# Patient Record
Sex: Female | Born: 1970 | Race: White | Hispanic: No | Marital: Married | Smoking: Never smoker
Health system: Southern US, Community
[De-identification: ages and names within clinical notes are randomized; demographics above are authoritative.]

## PROBLEM LIST (undated history)

## (undated) DIAGNOSIS — Z1371 Encounter for nonprocreative screening for genetic disease carrier status: Secondary | ICD-10-CM

## (undated) DIAGNOSIS — IMO0002 Reserved for concepts with insufficient information to code with codable children: Secondary | ICD-10-CM

## (undated) DIAGNOSIS — E559 Vitamin D deficiency, unspecified: Secondary | ICD-10-CM

## (undated) DIAGNOSIS — F32A Depression, unspecified: Secondary | ICD-10-CM

## (undated) DIAGNOSIS — F329 Major depressive disorder, single episode, unspecified: Secondary | ICD-10-CM

## (undated) DIAGNOSIS — Z8041 Family history of malignant neoplasm of ovary: Secondary | ICD-10-CM

## (undated) DIAGNOSIS — N200 Calculus of kidney: Secondary | ICD-10-CM

## (undated) DIAGNOSIS — Z87442 Personal history of urinary calculi: Secondary | ICD-10-CM

## (undated) DIAGNOSIS — F419 Anxiety disorder, unspecified: Secondary | ICD-10-CM

## (undated) HISTORY — DX: Calculus of kidney: N20.0

## (undated) HISTORY — PX: LASIK: SHX215

## (undated) HISTORY — PX: TONSILLECTOMY: SUR1361

## (undated) HISTORY — DX: Family history of malignant neoplasm of ovary: Z80.41

## (undated) HISTORY — DX: Depression, unspecified: F32.A

## (undated) HISTORY — DX: Vitamin D deficiency, unspecified: E55.9

## (undated) HISTORY — DX: Reserved for concepts with insufficient information to code with codable children: IMO0002

## (undated) HISTORY — DX: Major depressive disorder, single episode, unspecified: F32.9

## (undated) HISTORY — PX: INTRAUTERINE DEVICE (IUD) INSERTION: SHX5877

## (undated) HISTORY — DX: Encounter for nonprocreative screening for genetic disease carrier status: Z13.71

## (undated) HISTORY — DX: Anxiety disorder, unspecified: F41.9

---

## 1995-05-15 HISTORY — PX: CHOLECYSTECTOMY: SHX55

## 2001-05-14 DIAGNOSIS — IMO0002 Reserved for concepts with insufficient information to code with codable children: Secondary | ICD-10-CM

## 2001-05-14 HISTORY — DX: Reserved for concepts with insufficient information to code with codable children: IMO0002

## 2009-09-12 ENCOUNTER — Ambulatory Visit: Payer: Self-pay | Admitting: Unknown Physician Specialty

## 2014-11-12 DIAGNOSIS — Z1371 Encounter for nonprocreative screening for genetic disease carrier status: Secondary | ICD-10-CM

## 2014-11-12 HISTORY — DX: Encounter for nonprocreative screening for genetic disease carrier status: Z13.71

## 2016-03-19 ENCOUNTER — Other Ambulatory Visit: Payer: Self-pay | Admitting: Obstetrics and Gynecology

## 2016-03-19 DIAGNOSIS — Z1231 Encounter for screening mammogram for malignant neoplasm of breast: Secondary | ICD-10-CM

## 2016-04-17 ENCOUNTER — Ambulatory Visit
Admission: RE | Admit: 2016-04-17 | Discharge: 2016-04-17 | Disposition: A | Discharge status: Home / Self Care | Payer: BLUE CROSS/BLUE SHIELD | Source: Ambulatory Visit | Attending: Obstetrics and Gynecology | Admitting: Obstetrics and Gynecology

## 2016-04-17 DIAGNOSIS — Z1231 Encounter for screening mammogram for malignant neoplasm of breast: Secondary | ICD-10-CM | POA: Diagnosis present

## 2016-04-25 ENCOUNTER — Other Ambulatory Visit: Payer: Self-pay | Admitting: *Deleted

## 2016-04-25 ENCOUNTER — Inpatient Hospital Stay
Admission: RE | Admit: 2016-04-25 | Discharge: 2016-04-25 | Disposition: A | Discharge status: Home / Self Care | Payer: Self-pay | Source: Ambulatory Visit | Attending: *Deleted | Admitting: *Deleted

## 2016-04-25 DIAGNOSIS — Z9289 Personal history of other medical treatment: Secondary | ICD-10-CM

## 2016-04-30 ENCOUNTER — Other Ambulatory Visit: Payer: Self-pay | Admitting: Obstetrics and Gynecology

## 2016-04-30 DIAGNOSIS — N6489 Other specified disorders of breast: Secondary | ICD-10-CM

## 2016-04-30 DIAGNOSIS — R928 Other abnormal and inconclusive findings on diagnostic imaging of breast: Secondary | ICD-10-CM

## 2016-05-17 ENCOUNTER — Ambulatory Visit: Payer: BLUE CROSS/BLUE SHIELD

## 2016-05-17 ENCOUNTER — Ambulatory Visit
Admission: RE | Admit: 2016-05-17 | Discharge: 2016-05-17 | Disposition: A | Discharge status: Home / Self Care | Payer: PRIVATE HEALTH INSURANCE | Source: Ambulatory Visit | Attending: Obstetrics and Gynecology | Admitting: Obstetrics and Gynecology

## 2016-05-17 ENCOUNTER — Other Ambulatory Visit: Payer: BLUE CROSS/BLUE SHIELD

## 2016-05-17 DIAGNOSIS — N6489 Other specified disorders of breast: Secondary | ICD-10-CM

## 2016-05-17 DIAGNOSIS — R928 Other abnormal and inconclusive findings on diagnostic imaging of breast: Secondary | ICD-10-CM

## 2017-01-28 ENCOUNTER — Ambulatory Visit: Payer: Self-pay | Admitting: Obstetrics and Gynecology

## 2017-02-05 ENCOUNTER — Ambulatory Visit (INDEPENDENT_AMBULATORY_CARE_PROVIDER_SITE_OTHER): Payer: PRIVATE HEALTH INSURANCE | Admitting: Obstetrics and Gynecology

## 2017-02-05 ENCOUNTER — Encounter: Payer: Self-pay | Admitting: Obstetrics and Gynecology

## 2017-02-05 VITALS — BP 100/60 | HR 74 | Ht 63.0 in | Wt 155.0 lb

## 2017-02-05 DIAGNOSIS — Z01419 Encounter for gynecological examination (general) (routine) without abnormal findings: Secondary | ICD-10-CM

## 2017-02-05 DIAGNOSIS — Z0000 Encounter for general adult medical examination without abnormal findings: Secondary | ICD-10-CM | POA: Diagnosis not present

## 2017-02-05 DIAGNOSIS — Z8041 Family history of malignant neoplasm of ovary: Secondary | ICD-10-CM | POA: Insufficient documentation

## 2017-02-05 DIAGNOSIS — Z13220 Encounter for screening for lipoid disorders: Secondary | ICD-10-CM | POA: Diagnosis not present

## 2017-02-05 DIAGNOSIS — Z1151 Encounter for screening for human papillomavirus (HPV): Secondary | ICD-10-CM

## 2017-02-05 DIAGNOSIS — Z30431 Encounter for routine checking of intrauterine contraceptive device: Secondary | ICD-10-CM | POA: Diagnosis not present

## 2017-02-05 DIAGNOSIS — Z1231 Encounter for screening mammogram for malignant neoplasm of breast: Secondary | ICD-10-CM | POA: Diagnosis not present

## 2017-02-05 DIAGNOSIS — Z124 Encounter for screening for malignant neoplasm of cervix: Secondary | ICD-10-CM

## 2017-02-05 DIAGNOSIS — E559 Vitamin D deficiency, unspecified: Secondary | ICD-10-CM | POA: Diagnosis not present

## 2017-02-05 DIAGNOSIS — Z1239 Encounter for other screening for malignant neoplasm of breast: Secondary | ICD-10-CM

## 2017-02-05 NOTE — Progress Notes (Signed)
PCP:  Patient, No Pcp Per   Chief Complaint  Patient presents with  . Gynecologic Exam     HPI:      [name] is a 46 y.o. Patricia Randolph who LMP was No LMP recorded. Patient is not currently having periods (Reason: IUD)., presents today for her annual examination.  Her menses are absent with IUD.  Dysmenorrhea none. She had 3 weeks of light spotting a few wks ago. Unusual for pt but sx resolved.   Sex activity: single partner, contraception - IUD. Mirena placed 09/28/15 Last Pap: November 09, 2013  Results were: no abnormalities /neg HPV DNA  Hx of STDs: none  Last mammogram: May 17, 2016  Results were: normal--routine follow-up in 12 months There is no FH of breast cancer. There is a FH of ovarian cancer in her PGM and pat grt aunt. Pt is MyRisk neg 2016. The patient does not do self-breast exams.  Tobacco use: The patient denies current or previous tobacco use. Alcohol use: social drinker No drug use.  Exercise: moderately active  She does not get adequate calcium and Vitamin D in her diet.  She has not had recent fasting/screening labs. She has a hx of vit D deficiency but isn't taking her Vit D supp.   Past Medical History:  Diagnosis Date  . Anxiety   . BRCA negative 11/2014   MyRisk negative  . Depression   . Family history of ovarian cancer   . History of prescription drug abuse ([place]) 2003  . Vitamin D deficiency     Past Surgical History:  Procedure Laterality Date  . CESAREAN SECTION    . CHOLECYSTECTOMY  1997  . INTRAUTERINE DEVICE (IUD) INSERTION      Family History  Problem Relation Age of Onset  . Hypertension Mother   . Colon cancer Maternal Grandfather 63  . Ovarian cancer Paternal Grandmother 60  . Ovarian cancer Other 48  . Breast cancer Neg Hx     Social History   Social History  . Marital status: Married    Spouse name: N/A  . Number of children: N/A  . Years of education: N/A   Occupational History  . Not on file.   Social  History Main Topics  . Smoking status: Never Smoker  . Smokeless tobacco: Never Used  . Alcohol use Yes     Comment: socially  . Drug use: No  . Sexual activity: Yes    Birth control/ protection: IUD   Other Topics Concern  . Not on file   Social History Narrative  . No narrative on file    Current Meds  Medication Sig  . levonorgestrel (MIRENA) 20 MCG/24HR IUD 1 each by Intrauterine route once.     ROS:  Review of Systems  Constitutional: Negative for fatigue, fever and unexpected weight change.  Respiratory: Negative for cough, shortness of breath and wheezing.   Cardiovascular: Negative for chest pain, palpitations and leg swelling.  Gastrointestinal: Negative for blood in stool, constipation, diarrhea, nausea and vomiting.  Endocrine: Negative for cold intolerance, heat intolerance and polyuria.  Genitourinary: Negative for dyspareunia, dysuria, flank pain, frequency, genital sores, hematuria, menstrual problem, pelvic pain, urgency, vaginal bleeding, vaginal discharge and vaginal pain.  Musculoskeletal: Negative for back pain, joint swelling and myalgias.  Skin: Negative for rash.  Neurological: Negative for dizziness, syncope, light-headedness, numbness and headaches.  Hematological: Negative for adenopathy.  Psychiatric/Behavioral: Negative for agitation, confusion, sleep disturbance and suicidal ideas. The patient is not  nervous/anxious.      Objective: BP 100/60   Pulse 74   Ht 5' 3"  (1.6 m)   Wt 155 lb (70.3 kg)   BMI 27.46 kg/m    Physical Exam  Constitutional: She is oriented to person, place, and time. She appears well-developed and well-nourished.  Genitourinary: Vagina normal and uterus normal. There is no rash or tenderness on the right labia. There is no rash or tenderness on the left labia. No erythema or tenderness in the vagina. No vaginal discharge found. Right adnexum does not display mass and does not display tenderness. Left adnexum does not  display mass and does not display tenderness.  Cervix exhibits visible IUD strings. Cervix does not exhibit motion tenderness or polyp. Uterus is not enlarged or tender.  Neck: Normal range of motion. No thyromegaly present.  Cardiovascular: Normal rate, regular rhythm and normal heart sounds.   No murmur heard. Pulmonary/Chest: Effort normal and breath sounds normal. Right breast exhibits no mass, no nipple discharge, no skin change and no tenderness. Left breast exhibits no mass, no nipple discharge, no skin change and no tenderness.  Abdominal: Soft. There is no tenderness. There is no guarding.  Musculoskeletal: Normal range of motion.  Neurological: She is alert and oriented to person, place, and time. No cranial nerve deficit.  Psychiatric: She has a normal mood and affect. Her behavior is normal.  Vitals reviewed.   Assessment/Plan: Encounter for annual routine gynecological examination  Cervical cancer screening - Plan: IGP, Aptima HPV  Screening for HPV (human papillomavirus) - Plan: IGP, Aptima HPV  Screening for breast cancer - Pt to sched mammo. - Plan: MM DIGITAL SCREENING BILATERAL  Encounter for routine checking of intrauterine contraceptive device (IUD) - IUD in place. Due for rem 09/2020  Blood tests for routine general physical examination - Plan: Comprehensive metabolic panel, Lipid panel  Screening cholesterol level - Plan: Lipid panel  Vitamin D deficiency - Add Vit D3 2000 IU daily.   Family history of ovarian cancer - Pt is MyRisk neg. No further screening needed.   Meds ordered this encounter  Medications  . levonorgestrel (MIRENA) 20 MCG/24HR IUD    Sig: 1 each by Intrauterine route once.             GYN counsel mammography screening, adequate intake of calcium and vitamin D, diet and exercise     F/U  Return in about 1 year (around 02/05/2018).  Bertha Lokken B. Jerson Furukawa, PA-C 02/05/2017 9:14 AM

## 2017-02-05 NOTE — Addendum Note (Signed)
Addended by: Althea Grimmer B on: 02/05/2017 09:15 AM   Modules accepted: Orders

## 2017-02-05 NOTE — Patient Instructions (Signed)
[facility]: [phone]    

## 2017-02-07 LAB — IGP, APTIMA HPV
HPV Aptima: NEGATIVE
PAP Smear Comment: 0

## 2018-03-24 ENCOUNTER — Telehealth: Payer: Self-pay | Admitting: Obstetrics and Gynecology

## 2018-03-24 ENCOUNTER — Other Ambulatory Visit: Payer: Self-pay | Admitting: Obstetrics and Gynecology

## 2018-03-24 DIAGNOSIS — Z0000 Encounter for general adult medical examination without abnormal findings: Secondary | ICD-10-CM

## 2018-03-24 DIAGNOSIS — Z1231 Encounter for screening mammogram for malignant neoplasm of breast: Secondary | ICD-10-CM

## 2018-03-24 DIAGNOSIS — Z13220 Encounter for screening for lipoid disorders: Secondary | ICD-10-CM

## 2018-03-24 NOTE — Telephone Encounter (Signed)
New orders placed

## 2018-03-24 NOTE — Addendum Note (Signed)
Addended by: Althea Grimmer B on: 03/24/2018 01:28 PM   Modules accepted: Orders

## 2018-03-24 NOTE — Telephone Encounter (Signed)
Patient is schedule for patient labs Tomorrow But the order for those are no long valid. Would you please reorder Comprehensive Metabolic lab and Lipid panel? Thank you

## 2018-03-25 ENCOUNTER — Other Ambulatory Visit: Payer: PRIVATE HEALTH INSURANCE

## 2018-03-25 ENCOUNTER — Ambulatory Visit
Admission: RE | Admit: 2018-03-25 | Discharge: 2018-03-25 | Disposition: A | Discharge status: Home / Self Care | Payer: PRIVATE HEALTH INSURANCE | Source: Ambulatory Visit | Attending: Obstetrics and Gynecology | Admitting: Obstetrics and Gynecology

## 2018-03-25 DIAGNOSIS — Z1231 Encounter for screening mammogram for malignant neoplasm of breast: Secondary | ICD-10-CM | POA: Insufficient documentation

## 2018-03-25 DIAGNOSIS — Z0000 Encounter for general adult medical examination without abnormal findings: Secondary | ICD-10-CM

## 2018-03-25 DIAGNOSIS — Z13220 Encounter for screening for lipoid disorders: Secondary | ICD-10-CM

## 2018-03-26 ENCOUNTER — Telehealth: Payer: Self-pay | Admitting: Obstetrics and Gynecology

## 2018-03-26 LAB — COMPREHENSIVE METABOLIC PANEL
ALK PHOS: 57 IU/L (ref 39–117)
ALT: 154 IU/L — ABNORMAL HIGH (ref 0–32)
AST: 153 IU/L — AB (ref 0–40)
Albumin/Globulin Ratio: 1.7 (ref 1.2–2.2)
Albumin: 5.5 g/dL (ref 3.5–5.5)
BUN/Creatinine Ratio: 19 (ref 9–23)
BUN: 15 mg/dL (ref 6–24)
Bilirubin Total: 0.9 mg/dL (ref 0.0–1.2)
CO2: 22 mmol/L (ref 20–29)
CREATININE: 0.79 mg/dL (ref 0.57–1.00)
Calcium: 10.2 mg/dL (ref 8.7–10.2)
Chloride: 94 mmol/L — ABNORMAL LOW (ref 96–106)
GFR calc Af Amer: 105 mL/min/{1.73_m2} (ref >59)
GFR calc non Af Amer: 91 mL/min/{1.73_m2} (ref >59)
GLOBULIN, TOTAL: 3.3 g/dL (ref 1.5–4.5)
GLUCOSE: 68 mg/dL (ref 65–99)
Potassium: 4.9 mmol/L (ref 3.5–5.2)
SODIUM: 137 mmol/L (ref 134–144)
TOTAL PROTEIN: 8.8 g/dL — AB (ref 6.0–8.5)

## 2018-03-26 LAB — LIPID PANEL
Chol/HDL Ratio: 1.9 ratio (ref 0.0–4.4)
Cholesterol, Total: 258 mg/dL — ABNORMAL HIGH (ref 100–199)
HDL: 136 mg/dL (ref >39)
LDL Calculated: 107 mg/dL — ABNORMAL HIGH (ref 0–99)
TRIGLYCERIDES: 76 mg/dL (ref 0–149)
VLDL CHOLESTEROL CAL: 15 mg/dL (ref 5–40)

## 2018-03-26 NOTE — Telephone Encounter (Signed)
Pt aware of lipids and CMP. Has elevated LFTs. Pt taking about 1000 mg daily of tylenol for aches/pains and drinks alcohol daily. Recommended changing to NSAIDs and stopping alcohol. Pt thinks she can do that. Will rechk LFTs at 04/15/18 annual.

## 2018-04-15 ENCOUNTER — Ambulatory Visit (INDEPENDENT_AMBULATORY_CARE_PROVIDER_SITE_OTHER): Payer: PRIVATE HEALTH INSURANCE | Admitting: Obstetrics and Gynecology

## 2018-04-15 ENCOUNTER — Encounter: Payer: Self-pay | Admitting: Obstetrics and Gynecology

## 2018-04-15 VITALS — BP 118/70 | HR 82 | Ht 63.0 in | Wt 157.0 lb

## 2018-04-15 DIAGNOSIS — R7989 Other specified abnormal findings of blood chemistry: Secondary | ICD-10-CM

## 2018-04-15 DIAGNOSIS — Z01419 Encounter for gynecological examination (general) (routine) without abnormal findings: Secondary | ICD-10-CM | POA: Diagnosis not present

## 2018-04-15 DIAGNOSIS — Z1239 Encounter for other screening for malignant neoplasm of breast: Secondary | ICD-10-CM

## 2018-04-15 DIAGNOSIS — Z30431 Encounter for routine checking of intrauterine contraceptive device: Secondary | ICD-10-CM

## 2018-04-15 DIAGNOSIS — R945 Abnormal results of liver function studies: Secondary | ICD-10-CM

## 2018-04-15 NOTE — Progress Notes (Signed)
 PCP:  Copland, Alicia B, PA-C   Chief Complaint  Patient presents with  . Gynecologic Exam     HPI:      [name] is a 47 y.o. G2P2002 who LMP was No LMP recorded. (Menstrual status: IUD)., presents today for her annual examination.  Her menses are absent with IUD.  Dysmenorrhea none. No BTB.  Sex activity: single partner, contraception - IUD. Mirena placed 09/28/15 Last Pap: 02/05/17  Results were: no abnormalities /neg HPV DNA  Hx of STDs: none  Last mammogram: 03/25/18  Results were: normal--routine follow-up in 12 months There is no FH of breast cancer. There is a FH of ovarian cancer in her PGM and pat grt aunt and colon cancer in her MGF. Pt is MyRisk neg 2016. The patient does not do self-breast exams.  Tobacco use: The patient denies current or previous tobacco use. Alcohol use: social drinker--has cut back No drug use.  Exercise: moderately active  She does get adequate calcium but not Vitamin D in her diet. She has a hx of vit D deficiency but isn't taking supp like she should.   She had normal fasting labs 11/19 except for significantly elevated LFTs. Pt was taking tylenol regularly and drinking alcohol daily. Pt has stopped this and is due for lab recheck today.  Past Medical History:  Diagnosis Date  . Anxiety   . BRCA negative 11/2014   MyRisk negative  . Depression   . Family history of ovarian cancer   . History of prescription drug abuse 2003  . Vitamin D deficiency     Past Surgical History:  Procedure Laterality Date  . CESAREAN SECTION    . CHOLECYSTECTOMY  1997  . INTRAUTERINE DEVICE (IUD) INSERTION      Family History  Problem Relation Age of Onset  . Hypertension Mother   . Colon cancer Maternal Grandfather 50  . Ovarian cancer Paternal Grandmother 70  . Ovarian cancer Other 70  . Breast cancer Neg Hx     Social History   Socioeconomic History  . Marital status: Married    Spouse name: Not on file  . Number of children:  Not on file  . Years of education: Not on file  . Highest education level: Not on file  Occupational History  . Not on file  Social Needs  . Financial resource strain: Not on file  . Food insecurity:    Worry: Not on file    Inability: Not on file  . Transportation needs:    Medical: Not on file    Non-medical: Not on file  Tobacco Use  . Smoking status: Never Smoker  . Smokeless tobacco: Never Used  Substance and Sexual Activity  . Alcohol use: Yes    Comment: socially  . Drug use: No  . Sexual activity: Yes    Birth control/protection: IUD    Comment: Mirena  Lifestyle  . Physical activity:    Days per week: Not on file    Minutes per session: Not on file  . Stress: Not on file  Relationships  . Social connections:    Talks on phone: Not on file    Gets together: Not on file    Attends religious service: Not on file    Active member of club or organization: Not on file    Attends meetings of clubs or organizations: Not on file    Relationship status: Not on file  . Intimate partner violence:      Fear of current or ex partner: Not on file    Emotionally abused: Not on file    Physically abused: Not on file    Forced sexual activity: Not on file  Other Topics Concern  . Not on file  Social History Narrative  . Not on file    Current Meds  Medication Sig  . levonorgestrel (MIRENA) 20 MCG/24HR IUD 1 each by Intrauterine route once.     ROS:  Review of Systems  Constitutional: Negative for fatigue, fever and unexpected weight change.  Respiratory: Negative for cough, shortness of breath and wheezing.   Cardiovascular: Negative for chest pain, palpitations and leg swelling.  Gastrointestinal: Negative for blood in stool, constipation, diarrhea, nausea and vomiting.  Endocrine: Negative for cold intolerance, heat intolerance and polyuria.  Genitourinary: Negative for dyspareunia, dysuria, flank pain, frequency, genital sores, hematuria, menstrual problem, pelvic  pain, urgency, vaginal bleeding, vaginal discharge and vaginal pain.  Musculoskeletal: Negative for back pain, joint swelling and myalgias.  Skin: Negative for rash.  Neurological: Negative for dizziness, syncope, light-headedness, numbness and headaches.  Hematological: Negative for adenopathy.  Psychiatric/Behavioral: Negative for agitation, confusion, sleep disturbance and suicidal ideas. The patient is not nervous/anxious.      Objective: BP 118/70   Pulse 82   Ht 5' 3" (1.6 m)   Wt 157 lb (71.2 kg)   BMI 27.81 kg/m    Physical Exam  Constitutional: She is oriented to person, place, and time. She appears well-developed and well-nourished.  Genitourinary: Vagina normal and uterus normal. There is no rash or tenderness on the right labia. There is no rash or tenderness on the left labia. No erythema or tenderness in the vagina. No vaginal discharge found. Right adnexum does not display mass and does not display tenderness. Left adnexum does not display mass and does not display tenderness.  Cervix exhibits visible IUD strings. Cervix does not exhibit motion tenderness or polyp. Uterus is not enlarged or tender.  Neck: Normal range of motion. No thyromegaly present.  Cardiovascular: Normal rate, regular rhythm and normal heart sounds.  No murmur heard. Pulmonary/Chest: Effort normal and breath sounds normal. Right breast exhibits no mass, no nipple discharge, no skin change and no tenderness. Left breast exhibits no mass, no nipple discharge, no skin change and no tenderness.  Abdominal: Soft. There is no tenderness. There is no guarding.  Musculoskeletal: Normal range of motion.  Neurological: She is alert and oriented to person, place, and time. No cranial nerve deficit.  Psychiatric: She has a normal mood and affect. Her behavior is normal.  Vitals reviewed.   Assessment/Plan: Encounter for annual routine gynecological examination  Encounter for routine checking of  intrauterine contraceptive device (IUD) - IUD in place  Screening for breast cancer - Pt current on mammo  Abnormal LFTs - REchk labs today. Will call with results.  - Plan: Comprehensive metabolic panel        GYN counsel mammography screening, adequate intake of calcium and vitamin D, diet and exercise     F/U  Return in about 1 year (around 04/16/2019).  Alicia B. Copland, PA-C 04/15/2018 8:48 AM 

## 2018-04-15 NOTE — Patient Instructions (Signed)
I value your feedback and entrusting us with your care. If you get a [facility] patient survey, I would appreciate you taking the time to let us know about your experience today. Thank you! 

## 2018-04-16 LAB — COMPREHENSIVE METABOLIC PANEL
A/G RATIO: 1.9 (ref 1.2–2.2)
ALBUMIN: 4.9 g/dL (ref 3.5–5.5)
ALT: 19 IU/L (ref 0–32)
AST: 19 IU/L (ref 0–40)
Alkaline Phosphatase: 42 IU/L (ref 39–117)
BILIRUBIN TOTAL: 0.9 mg/dL (ref 0.0–1.2)
BUN / CREAT RATIO: 14 (ref 9–23)
BUN: 10 mg/dL (ref 6–24)
CHLORIDE: 101 mmol/L (ref 96–106)
CO2: 22 mmol/L (ref 20–29)
Calcium: 9.2 mg/dL (ref 8.7–10.2)
Creatinine, Ser: 0.74 mg/dL (ref 0.57–1.00)
GFR calc non Af Amer: 98 mL/min/{1.73_m2} (ref >59)
GFR, EST AFRICAN AMERICAN: 113 mL/min/{1.73_m2} (ref >59)
Globulin, Total: 2.6 g/dL (ref 1.5–4.5)
Glucose: 80 mg/dL (ref 65–99)
POTASSIUM: 4.2 mmol/L (ref 3.5–5.2)
SODIUM: 140 mmol/L (ref 134–144)
TOTAL PROTEIN: 7.5 g/dL (ref 6.0–8.5)

## 2018-04-16 NOTE — Progress Notes (Signed)
Pls let pt know labs WNL. Liver values normal. F/u prn.

## 2018-04-16 NOTE — Progress Notes (Signed)
Pt aware.

## 2018-12-20 ENCOUNTER — Telehealth: Payer: PRIVATE HEALTH INSURANCE | Admitting: Nurse Practitioner

## 2018-12-20 DIAGNOSIS — N3000 Acute cystitis without hematuria: Secondary | ICD-10-CM | POA: Diagnosis not present

## 2018-12-20 MED ORDER — CEPHALEXIN 500 MG PO CAPS: 500.0000 mg | ORAL_CAPSULE | Freq: Two times a day (BID) | ORAL | 0 refills | Status: DC

## 2018-12-20 NOTE — Progress Notes (Signed)

## 2019-01-13 ENCOUNTER — Other Ambulatory Visit: Payer: Self-pay | Admitting: Emergency Medicine

## 2019-01-13 DIAGNOSIS — R6889 Other general symptoms and signs: Secondary | ICD-10-CM | POA: Diagnosis not present

## 2019-01-13 DIAGNOSIS — Z20822 Contact with and (suspected) exposure to covid-19: Secondary | ICD-10-CM

## 2019-01-14 LAB — NOVEL CORONAVIRUS, NAA: SARS-CoV-2, NAA: NOT DETECTED

## 2019-02-03 ENCOUNTER — Other Ambulatory Visit: Payer: Self-pay

## 2019-02-03 DIAGNOSIS — Z20822 Contact with and (suspected) exposure to covid-19: Secondary | ICD-10-CM

## 2019-02-03 DIAGNOSIS — R6889 Other general symptoms and signs: Secondary | ICD-10-CM | POA: Diagnosis not present

## 2019-02-04 LAB — NOVEL CORONAVIRUS, NAA: SARS-CoV-2, NAA: NOT DETECTED

## 2019-02-09 ENCOUNTER — Other Ambulatory Visit: Payer: Self-pay

## 2019-02-09 DIAGNOSIS — Z20822 Contact with and (suspected) exposure to covid-19: Secondary | ICD-10-CM

## 2019-02-10 LAB — NOVEL CORONAVIRUS, NAA: SARS-CoV-2, NAA: NOT DETECTED

## 2019-02-16 ENCOUNTER — Other Ambulatory Visit: Payer: Self-pay

## 2019-02-16 ENCOUNTER — Ambulatory Visit (INDEPENDENT_AMBULATORY_CARE_PROVIDER_SITE_OTHER): Payer: BC Managed Care – PPO | Admitting: Obstetrics and Gynecology

## 2019-02-16 ENCOUNTER — Encounter: Payer: Self-pay | Admitting: Obstetrics and Gynecology

## 2019-02-16 VITALS — BP 104/80 | Ht 63.0 in | Wt 153.0 lb

## 2019-02-16 DIAGNOSIS — Z23 Encounter for immunization: Secondary | ICD-10-CM

## 2019-02-16 DIAGNOSIS — N3001 Acute cystitis with hematuria: Secondary | ICD-10-CM | POA: Diagnosis not present

## 2019-02-16 LAB — POCT URINALYSIS DIPSTICK
Bilirubin, UA: NEGATIVE
Glucose, UA: NEGATIVE
Ketones, UA: NEGATIVE
Nitrite, UA: NEGATIVE
Protein, UA: NEGATIVE
Spec Grav, UA: 1.01 (ref 1.010–1.025)
pH, UA: 7 (ref 5.0–8.0)

## 2019-02-16 MED ORDER — NITROFURANTOIN MONOHYD MACRO 100 MG PO CAPS: 100.0000 mg | ORAL_CAPSULE | Freq: Two times a day (BID) | ORAL | 0 refills | Status: AC

## 2019-02-16 NOTE — Patient Instructions (Signed)
I value your feedback and entrusting us with your care. If you get a [facility] patient survey, I would appreciate you taking the time to let us know about your experience today. Thank you! 

## 2019-02-16 NOTE — Progress Notes (Signed)
Majour Frei, Deirdre Evener, PA-C   Chief Complaint  Patient presents with  . Urinary Tract Infection    frequent urination, back pain, no blood in urine or burning urinating since last night, took AZO around 2 am, pt has requested flu shot    HPI:      [name] is a 48 y.o. [id number] who LMP was No LMP recorded. (Menstrual status: IUD)., presents today for UTI sx of urinary frequency, urgency, LBP since last night. No hematuria/dysuria, pelvic pain, fevers. No vag sx, no vag bleeding. Taking AZO. Had similar sx 8/20 and did evisit, given keflex BID for 5 days. Sx improved but have recurred. Last UTI about 19 yrs ago prior to this.  Past Medical History:  Diagnosis Date  . Anxiety   . BRCA negative 11/2014   MyRisk negative  . Depression   . Family history of ovarian cancer   . History of prescription drug abuse 2003  . Vitamin D deficiency     Past Surgical History:  Procedure Laterality Date  . CESAREAN SECTION    . CHOLECYSTECTOMY  1997  . INTRAUTERINE DEVICE (IUD) INSERTION      Family History  Problem Relation Age of Onset  . Hypertension Mother   . Colon cancer Maternal Grandfather 34  . Ovarian cancer Paternal Grandmother 73  . Ovarian cancer Other 54  . Breast cancer Neg Hx     Social History   Socioeconomic History  . Marital status: Married    Spouse name: Not on file  . Number of children: Not on file  . Years of education: Not on file  . Highest education level: Not on file  Occupational History  . Not on file  Social Needs  . Financial resource strain: Not on file  . Food insecurity    Worry: Not on file    Inability: Not on file  . Transportation needs    Medical: Not on file    Non-medical: Not on file  Tobacco Use  . Smoking status: Never Smoker  . Smokeless tobacco: Never Used  Substance and Sexual Activity  . Alcohol use: Yes    Comment: socially  . Drug use: No  . Sexual activity: Yes    Birth control/protection: I.U.D.   Comment: Mirena  Lifestyle  . Physical activity    Days per week: Not on file    Minutes per session: Not on file  . Stress: Not on file  Relationships  . Social Herbalist on phone: Not on file    Gets together: Not on file    Attends religious service: Not on file    Active member of club or organization: Not on file    Attends meetings of clubs or organizations: Not on file    Relationship status: Not on file  . Intimate partner violence    Fear of current or ex partner: Not on file    Emotionally abused: Not on file    Physically abused: Not on file    Forced sexual activity: Not on file  Other Topics Concern  . Not on file  Social History Narrative  . Not on file    Outpatient Medications Prior to Visit  Medication Sig Dispense Refill  . levonorgestrel (MIRENA) 20 MCG/24HR IUD 1 each by Intrauterine route once.    . cephALEXin (KEFLEX) 500 MG capsule Take 1 capsule (500 mg total) by mouth 2 (two) times daily. 14 capsule 0  No facility-administered medications prior to visit.       ROS:  Review of Systems  Constitutional: Negative for fever.  Gastrointestinal: Negative for blood in stool, constipation, diarrhea, nausea and vomiting.  Genitourinary: Positive for frequency and urgency. Negative for dyspareunia, dysuria, flank pain, hematuria, vaginal bleeding, vaginal discharge and vaginal pain.  Musculoskeletal: Positive for back pain.  Skin: Negative for rash.   OBJECTIVE:   Vitals:  BP 104/80   Ht 5' 3"  (1.6 m)   Wt 153 lb (69.4 kg)   BMI 27.10 kg/m   Physical Exam Vitals signs reviewed.  Constitutional:      Appearance: She is well-developed. She is not ill-appearing or toxic-appearing.  Neck:     Musculoskeletal: Normal range of motion.  Pulmonary:     Effort: Pulmonary effort is normal.  Abdominal:     Tenderness: There is no right CVA tenderness or left CVA tenderness.  Musculoskeletal: Normal range of motion.  Neurological:      General: No focal deficit present.     Mental Status: She is alert and oriented to person, place, and time.     Cranial Nerves: No cranial nerve deficit.  Psychiatric:        Behavior: Behavior normal.        Thought Content: Thought content normal.        Judgment: Judgment normal.     Results: Results for orders placed or performed in visit on 02/16/19 (from the past 24 hour(s))  POCT Urinalysis Dipstick     Status: Abnormal   Collection Time: 02/16/19 12:03 PM  Result Value Ref Range   Color, UA orange    Clarity, UA clear    Glucose, UA Negative Negative   Bilirubin, UA neg    Ketones, UA neg    Spec Grav, UA 1.010 1.010 - 1.025   Blood, UA mod    pH, UA 7.0 5.0 - 8.0   Protein, UA Negative Negative   Urobilinogen, UA     Nitrite, UA neg    Leukocytes, UA Small (1+) (A) Negative   Appearance     Odor       Assessment/Plan: Acute cystitis with hematuria - Plan: POCT Urinalysis Dipstick, nitrofurantoin, macrocrystal-monohydrate, (MACROBID) 100 MG capsule, Urine Culture; Pos sx/UA. Rx macrobid. Check C&S. Will f/u with results.   Needs flu shot - Plan: Flu Vaccine QUAD 36+ mos IM (Fluarix, Quad PF)    Meds ordered this encounter  Medications  . nitrofurantoin, macrocrystal-monohydrate, (MACROBID) 100 MG capsule    Sig: Take 1 capsule (100 mg total) by mouth 2 (two) times daily for 7 days.    Dispense:  14 capsule    Refill:  0    Order Specific Question:   Supervising Provider    Answer:   Gae Dry [[id number]]      Return if symptoms worsen or fail to improve.  Patricia Kalama B. Obelia Bonello, PA-C 02/16/2019 1:36 PM

## 2019-02-18 ENCOUNTER — Telehealth: Payer: Self-pay | Admitting: Obstetrics and Gynecology

## 2019-02-18 LAB — URINE CULTURE

## 2019-02-18 NOTE — Telephone Encounter (Signed)
LM for pt with neg C&S. Given hematuria, needs to RTO in 1 wk for UA with nurse.

## 2019-02-23 ENCOUNTER — Other Ambulatory Visit: Payer: Self-pay

## 2019-02-23 DIAGNOSIS — Z20828 Contact with and (suspected) exposure to other viral communicable diseases: Secondary | ICD-10-CM | POA: Diagnosis not present

## 2019-02-23 DIAGNOSIS — Z20822 Contact with and (suspected) exposure to covid-19: Secondary | ICD-10-CM

## 2019-02-24 ENCOUNTER — Other Ambulatory Visit: Payer: Self-pay

## 2019-02-24 ENCOUNTER — Ambulatory Visit (INDEPENDENT_AMBULATORY_CARE_PROVIDER_SITE_OTHER): Payer: BC Managed Care – PPO

## 2019-02-24 DIAGNOSIS — N390 Urinary tract infection, site not specified: Secondary | ICD-10-CM

## 2019-02-24 DIAGNOSIS — R319 Hematuria, unspecified: Secondary | ICD-10-CM | POA: Diagnosis not present

## 2019-02-24 LAB — NOVEL CORONAVIRUS, NAA: SARS-CoV-2, NAA: NOT DETECTED

## 2019-02-24 LAB — POCT URINALYSIS DIPSTICK
Glucose, UA: NEGATIVE
Leukocytes, UA: NEGATIVE
Nitrite, UA: NEGATIVE
Protein, UA: POSITIVE — AB
Spec Grav, UA: 1.01 (ref 1.010–1.025)
Urobilinogen, UA: NEGATIVE E.U./dL — AB
pH, UA: 7 (ref 5.0–8.0)

## 2019-02-24 NOTE — Progress Notes (Signed)
Pls let pt know urine is normal, no blood in urine. F/u prn.

## 2019-02-24 NOTE — Progress Notes (Signed)
Pt came in office to drop off urine sample per ABC.

## 2019-02-24 NOTE — Progress Notes (Signed)
Pt aware.

## 2019-03-11 DIAGNOSIS — Z20828 Contact with and (suspected) exposure to other viral communicable diseases: Secondary | ICD-10-CM | POA: Diagnosis not present

## 2019-03-13 ENCOUNTER — Other Ambulatory Visit: Payer: Self-pay

## 2019-03-13 DIAGNOSIS — Z20822 Contact with and (suspected) exposure to covid-19: Secondary | ICD-10-CM

## 2019-03-14 LAB — NOVEL CORONAVIRUS, NAA: SARS-CoV-2, NAA: NOT DETECTED

## 2019-03-18 NOTE — Patient Instructions (Signed)
I value your feedback and entrusting us with your care. If you get a [facility] patient survey, I would appreciate you taking the time to let us know about your experience today. Thank you! 

## 2019-03-18 NOTE — Progress Notes (Signed)
[place], Deirdre Evener, PA-C   Chief Complaint  Patient presents with  . Urinary Tract Infection    urinary frequency, bladder pain, back pain, no blood in urine or burning sensation when urinating x 1 month    HPI:      [name] is a 48 y.o. [id number] who LMP was No LMP recorded. (Menstrual status: IUD)., presents today for urinary frequency/urgency/pelvic pain and RT mid back pain for the past 1 1/2 wks. No dysuria, hematuria. Treating with ibup/bath for pelvic pain and back pain. Trying to cut back on caffeine, drinks very little water due to urgency/frequency. Had UTI sx (without pelvic pain) 02/16/19 with hematuria on UA and neg C&S, treated with macrobid without sx relief. Repeat UA 1 wk later was neg. Had similar sx 8/20 and did evisit, given keflex BID for 5 days. In hindsight, pt thinks she may have had a kidney stone 8/20 due to LT flank pain.  Last UTI about 19 yrs ago prior to this. No vag or GI sx.   Pt has IUD, is amenorrheic. No irreg bleeding. She is sex active, no new partners, no dyspareunia.   Last annual 12/19.  Patient Active Problem List   Diagnosis Date Noted  . Vitamin D deficiency 02/05/2017  . Family history of ovarian cancer 02/05/2017    Past Surgical History:  Procedure Laterality Date  . CESAREAN SECTION    . CHOLECYSTECTOMY  1997  . INTRAUTERINE DEVICE (IUD) INSERTION      Family History  Problem Relation Age of Onset  . Hypertension Mother   . Colon cancer Maternal Grandfather 42  . Ovarian cancer Paternal Grandmother 76  . Ovarian cancer Other 96  . Breast cancer Neg Hx     Social History   Socioeconomic History  . Marital status: Married    Spouse name: Not on file  . Number of children: Not on file  . Years of education: Not on file  . Highest education level: Not on file  Occupational History  . Not on file  Social Needs  . Financial resource strain: Not on file  . Food insecurity    Worry: Not on file    Inability: Not  on file  . Transportation needs    Medical: Not on file    Non-medical: Not on file  Tobacco Use  . Smoking status: Never Smoker  . Smokeless tobacco: Never Used  Substance and Sexual Activity  . Alcohol use: Yes    Comment: socially  . Drug use: No  . Sexual activity: Yes    Birth control/protection: I.U.D.    Comment: Mirena  Lifestyle  . Physical activity    Days per week: Not on file    Minutes per session: Not on file  . Stress: Not on file  Relationships  . Social Herbalist on phone: Not on file    Gets together: Not on file    Attends religious service: Not on file    Active member of club or organization: Not on file    Attends meetings of clubs or organizations: Not on file    Relationship status: Not on file  . Intimate partner violence    Fear of current or ex partner: Not on file    Emotionally abused: Not on file    Physically abused: Not on file    Forced sexual activity: Not on file  Other Topics Concern  . Not on file  Social  History Narrative  . Not on file    Outpatient Medications Prior to Visit  Medication Sig Dispense Refill  . levonorgestrel (MIRENA) 20 MCG/24HR IUD 1 each by Intrauterine route once.     No facility-administered medications prior to visit.       ROS:  Review of Systems  Constitutional: Negative for fatigue, fever and unexpected weight change.  Respiratory: Negative for cough, shortness of breath and wheezing.   Cardiovascular: Negative for chest pain, palpitations and leg swelling.  Gastrointestinal: Negative for blood in stool, constipation, diarrhea, nausea and vomiting.  Endocrine: Negative for cold intolerance, heat intolerance and polyuria.  Genitourinary: Positive for frequency, pelvic pain and urgency. Negative for dyspareunia, dysuria, flank pain, genital sores, hematuria, menstrual problem, vaginal bleeding, vaginal discharge and vaginal pain.  Musculoskeletal: Positive for back pain. Negative for joint  swelling and myalgias.  Skin: Negative for rash.  Neurological: Negative for dizziness, syncope, light-headedness, numbness and headaches.  Hematological: Negative for adenopathy.  Psychiatric/Behavioral: Negative for agitation, confusion, sleep disturbance and suicidal ideas. The patient is not nervous/anxious.     OBJECTIVE:   Vitals:  BP 100/70   Ht 5\' 2"  (1.575 m)   Wt 151 lb (68.5 kg)   BMI 27.62 kg/m   Physical Exam Vitals signs reviewed.  Constitutional:      Appearance: She is well-developed. She is not ill-appearing or toxic-appearing.  Neck:     Musculoskeletal: Normal range of motion.  Pulmonary:     Effort: Pulmonary effort is normal.  Abdominal:     Palpations: Abdomen is soft.     Tenderness: There is abdominal tenderness in the suprapubic area. There is no right CVA tenderness or left CVA tenderness.  Musculoskeletal: Normal range of motion.  Neurological:     General: No focal deficit present.     Mental Status: She is alert and oriented to person, place, and time.     Cranial Nerves: No cranial nerve deficit.  Psychiatric:        Behavior: Behavior normal.        Thought Content: Thought content normal.        Judgment: Judgment normal.     Results: Results for orders placed or performed in visit on 03/19/19 (from the past 24 hour(s))  POCT Urinalysis Dipstick     Status: Normal   Collection Time: 03/19/19  9:12 AM  Result Value Ref Range   Color, UA yellow    Clarity, UA clear    Glucose, UA Negative Negative   Bilirubin, UA neg    Ketones, UA neg    Spec Grav, UA 1.015 1.010 - 1.025   Blood, UA neg    pH, UA 6.0 5.0 - 8.0   Protein, UA Negative Negative   Urobilinogen, UA     Nitrite, UA neg    Leukocytes, UA Negative Negative   Appearance     Odor       Assessment/Plan: Urinary frequency - Plan: POCT Urinalysis Dipstick, Urine Culture-GYN; Neg UA. Check C&S. Will f/u with results. D/c caffeine, try AZO in meantime in case bladder  spasms. If C&S neg and sx persist, will refer to urology.   Pelvic pain--tender on exam. Check C&S. If neg and sx persist, will check GYN u/s for IUD placement/ovar eval.     Return if symptoms worsen or fail to improve.  Lakiesha Ralphs B. Pamula Luther, PA-C 03/19/2019 9:14 AM

## 2019-03-19 ENCOUNTER — Ambulatory Visit (INDEPENDENT_AMBULATORY_CARE_PROVIDER_SITE_OTHER): Payer: BC Managed Care – PPO | Admitting: Obstetrics and Gynecology

## 2019-03-19 ENCOUNTER — Encounter: Payer: Self-pay | Admitting: Obstetrics and Gynecology

## 2019-03-19 ENCOUNTER — Other Ambulatory Visit: Payer: Self-pay

## 2019-03-19 VITALS — BP 100/70 | Ht 62.0 in | Wt 151.0 lb

## 2019-03-19 DIAGNOSIS — R102 Pelvic and perineal pain: Secondary | ICD-10-CM | POA: Diagnosis not present

## 2019-03-19 DIAGNOSIS — R350 Frequency of micturition: Secondary | ICD-10-CM

## 2019-03-19 LAB — POCT URINALYSIS DIPSTICK
Bilirubin, UA: NEGATIVE
Blood, UA: NEGATIVE
Glucose, UA: NEGATIVE
Ketones, UA: NEGATIVE
Leukocytes, UA: NEGATIVE
Nitrite, UA: NEGATIVE
Protein, UA: NEGATIVE
Spec Grav, UA: 1.015 (ref 1.010–1.025)
pH, UA: 6 (ref 5.0–8.0)

## 2019-03-20 ENCOUNTER — Other Ambulatory Visit: Payer: Self-pay

## 2019-03-20 ENCOUNTER — Emergency Department: Payer: BC Managed Care – PPO

## 2019-03-20 ENCOUNTER — Emergency Department
Admission: EM | Admit: 2019-03-20 | Discharge: 2019-03-20 | Disposition: A | Discharge status: Home / Self Care | Payer: BC Managed Care – PPO | Attending: Emergency Medicine | Admitting: Emergency Medicine

## 2019-03-20 DIAGNOSIS — Z975 Presence of (intrauterine) contraceptive device: Secondary | ICD-10-CM | POA: Insufficient documentation

## 2019-03-20 DIAGNOSIS — N200 Calculus of kidney: Secondary | ICD-10-CM | POA: Insufficient documentation

## 2019-03-20 DIAGNOSIS — R109 Unspecified abdominal pain: Secondary | ICD-10-CM | POA: Diagnosis not present

## 2019-03-20 DIAGNOSIS — R102 Pelvic and perineal pain: Secondary | ICD-10-CM | POA: Diagnosis not present

## 2019-03-20 LAB — URINALYSIS, COMPLETE (UACMP) WITH MICROSCOPIC: Specific Gravity, Urine: 1.025 (ref 1.005–1.030)

## 2019-03-20 LAB — BASIC METABOLIC PANEL
Anion gap: 9 (ref 5–15)
BUN: 17 mg/dL (ref 6–20)
CO2: 24 mmol/L (ref 22–32)
Calcium: 9.3 mg/dL (ref 8.9–10.3)
Chloride: 102 mmol/L (ref 98–111)
Creatinine, Ser: 0.68 mg/dL (ref 0.44–1.00)
GFR calc Af Amer: >60 mL/min (ref >60)
GFR calc non Af Amer: >60 mL/min (ref >60)
Glucose, Bld: 100 mg/dL — ABNORMAL HIGH (ref 70–99)
Potassium: 4.2 mmol/L (ref 3.5–5.1)
Sodium: 135 mmol/L (ref 135–145)

## 2019-03-20 LAB — CBC
HCT: 39.1 % (ref 36.0–46.0)
Hemoglobin: 13.5 g/dL (ref 12.0–15.0)
MCH: 32.8 pg (ref 26.0–34.0)
MCHC: 34.5 g/dL (ref 30.0–36.0)
MCV: 94.9 fL (ref 80.0–100.0)
Platelets: 295 10*3/uL (ref 150–400)
RBC: 4.12 MIL/uL (ref 3.87–5.11)
RDW: 12.2 % (ref 11.5–15.5)
WBC: 5.9 10*3/uL (ref 4.0–10.5)
nRBC: 0 % (ref 0.0–0.2)

## 2019-03-20 LAB — POCT PREGNANCY, URINE: Preg Test, Ur: NEGATIVE

## 2019-03-20 MED ORDER — KETOROLAC TROMETHAMINE 30 MG/ML IJ SOLN: 30.0000 mg | Freq: Once | INTRAMUSCULAR | Status: DC

## 2019-03-20 MED ORDER — OXYCODONE-ACETAMINOPHEN 7.5-325 MG PO TABS: 1.0000 | ORAL_TABLET | Freq: Four times a day (QID) | ORAL | 0 refills | Status: DC | PRN

## 2019-03-20 MED ORDER — KETOROLAC TROMETHAMINE 60 MG/2ML IM SOLN
60.0000 mg | Freq: Once | INTRAMUSCULAR | Status: AC
Start: 2019-03-20 — End: 2019-03-20
  Administered 2019-03-20: 13:00:00 60 mg via INTRAMUSCULAR
  Filled 2019-03-20: qty 2

## 2019-03-20 NOTE — ED Provider Notes (Signed)
[facility] Emergency Department Provider Note   ____________________________________________   First MD Initiated Contact with Patient 03/20/19 1140     (approximate)  I have reviewed the triage vital signs and the nursing notes.   HISTORY  Chief Complaint Pelvic Pain    HPI Patricia Randolph is a 48 y.o. female patient complained intimating right flank and pelvic pain for 1 month.  Patient state intimating hematuria but none present in the last 2 days.  Patient had some mild nausea and diarrhea which she believes secondary to a viral infection.  Patient denies fever or vaginal discharge.  Patient rates her pain as a 7/10.  Patient described the pain as "achy".  No palliative measures for complaint.         Past Medical History:  Diagnosis Date  . Anxiety   . BRCA negative 11/2014   MyRisk negative  . Depression   . Family history of ovarian cancer   . History of prescription drug abuse 2003  . Vitamin D deficiency     Patient Active Problem List   Diagnosis Date Noted  . Vitamin D deficiency 02/05/2017  . Family history of ovarian cancer 02/05/2017    Past Surgical History:  Procedure Laterality Date  . CESAREAN SECTION    . CHOLECYSTECTOMY  1997  . INTRAUTERINE DEVICE (IUD) INSERTION      Prior to Admission medications   Medication Sig Start Date End Date Taking? Authorizing Provider  levonorgestrel (MIRENA) 20 MCG/24HR IUD 1 each by Intrauterine route once.    [provider]  oxyCODONE-acetaminophen (PERCOCET) 7.5-325 MG tablet Take 1 tablet by mouth every 6 (six) hours as needed. 03/20/19   Sable Feil, PA-C    Allergies Sulfa antibiotics  Family History  Problem Relation Age of Onset  . Hypertension Mother   . Colon cancer Maternal Grandfather 26  . Ovarian cancer Paternal Grandmother 46  . Ovarian cancer Other 1  . Breast cancer Neg Hx     Social History Social History   Tobacco Use  . Smoking status:  Never Smoker  . Smokeless tobacco: Never Used  Substance Use Topics  . Alcohol use: Yes    Comment: socially  . Drug use: No    Review of Systems Constitutional: No fever/chills Eyes: No visual changes. ENT: No sore throat. Cardiovascular: Denies chest pain. Respiratory: Denies shortness of breath. Gastrointestinal: No abdominal pain.  No nausea, no vomiting.  No diarrhea.  No constipation. Genitourinary: Negative for dysuria. Musculoskeletal: Right flank pain.   Skin: Negative for rash. Neurological: Negative for headaches, focal weakness or numbness. Psychiatric:  Anxiety. Allergic/Immunilogical: Sulfur antibiotics. ____________________________________________   PHYSICAL EXAM:  VITAL SIGNS: ED Triage Vitals  Enc Vitals Group     BP 03/20/19 0734 124/68     Pulse Rate 03/20/19 0734 70     Resp 03/20/19 0734 16     Temp 03/20/19 0738 98.1 F (36.7 C)     Temp Source 03/20/19 0734 Oral     SpO2 03/20/19 0734 97 %     Weight 03/20/19 0734 151 lb (68.5 kg)     Height 03/20/19 0734 5' 2"  (1.575 m)     Head Circumference --      Peak Flow --      Pain Score 03/20/19 0734 7     Pain Loc --      Pain Edu? --      Excl. in [place]? --    Constitutional: Alert and oriented.  Well appearing and in no acute distress. Cardiovascular: Normal rate, regular rhythm. Grossly normal heart sounds.  Good peripheral circulation. Respiratory: Normal respiratory effort.  No retractions. Lungs CTAB. Gastrointestinal: Soft and nontender. No distention. No abdominal bruits.  Right CVA tenderness. Genitourinary: Deferred Musculoskeletal: No lower extremity tenderness nor edema.  No joint effusions. Neurologic:  Normal speech and language. No gross focal neurologic deficits are appreciated. No gait instability. Skin:  Skin is warm, dry and intact. No rash noted. Psychiatric: Mood and affect are normal. Speech and behavior are normal.  ____________________________________________   LABS (all  labs ordered are listed, but only abnormal results are displayed)  Labs Reviewed  URINALYSIS, COMPLETE (UACMP) WITH MICROSCOPIC - Abnormal; Notable for the following components:      Result Value   Color, Urine ORANGE (*)    APPearance HAZY (*)    Glucose, UA   (*)    Value: TEST NOT REPORTED DUE TO COLOR INTERFERENCE OF URINE PIGMENT   Hgb urine dipstick   (*)    Value: TEST NOT REPORTED DUE TO COLOR INTERFERENCE OF URINE PIGMENT   Bilirubin Urine   (*)    Value: TEST NOT REPORTED DUE TO COLOR INTERFERENCE OF URINE PIGMENT   Ketones, ur   (*)    Value: TEST NOT REPORTED DUE TO COLOR INTERFERENCE OF URINE PIGMENT   Protein, ur   (*)    Value: TEST NOT REPORTED DUE TO COLOR INTERFERENCE OF URINE PIGMENT   Nitrite   (*)    Value: TEST NOT REPORTED DUE TO COLOR INTERFERENCE OF URINE PIGMENT   Leukocytes,Ua   (*)    Value: TEST NOT REPORTED DUE TO COLOR INTERFERENCE OF URINE PIGMENT   Bacteria, UA RARE (*)    All other components within normal limits  BASIC METABOLIC PANEL - Abnormal; Notable for the following components:   Glucose, Bld 100 (*)    All other components within normal limits  CBC  POCT PREGNANCY, URINE  POC URINE PREG, ED   ____________________________________________  EKG   ____________________________________________  RADIOLOGY  ED MD interpretation:    Official radiology report(s): Ct Renal Stone Study  Result Date: 03/20/2019 CLINICAL DATA:  Intermittent right flank pain for the past month, worsened and severe this morning. History of urinary tract infections. EXAM: CT ABDOMEN AND PELVIS WITHOUT CONTRAST TECHNIQUE: Multidetector CT imaging of the abdomen and pelvis was performed following the standard protocol without IV contrast. COMPARISON:  None. FINDINGS: Lower chest: Borderline enlarged heart. Clear lung bases. Hepatobiliary: No focal liver abnormality is seen. Status post cholecystectomy. No biliary dilatation. Pancreas: Unremarkable. No pancreatic  ductal dilatation or surrounding inflammatory changes. Spleen: Normal in size without focal abnormality. Adrenals/Urinary Tract: Normal appearing adrenal glands. Mild-to-moderate dilatation of the right renal collecting system and moderate dilatation of the ureter to the level of a 9 x 5 mm calculus in the distal right ureter, 1 cm proximal to the ureterovesical junction. There are also adjacent 9 mm and 3 mm calculi in the upper pole of the right kidney. Normal appearing left kidney, left ureter and urinary bladder. Stomach/Bowel: Unremarkable stomach, small bowel and colon. No evidence of appendicitis. Vascular/Lymphatic: No significant vascular findings are present. No enlarged abdominal or pelvic lymph nodes. Reproductive: Intrauterine device in expected position in the uterus. No adnexal masses. Other: Small umbilical hernia containing fat. Musculoskeletal: Mild lumbar spine degenerative changes. IMPRESSION: 1. 9 x 5 mm distal right ureteral calculus causing mild-to-moderate right hydronephrosis and moderate hydroureter. 2. 9 mm and  3 mm upper pole right renal calculi. 3. Small umbilical hernia containing fat. Electronically Signed   By: Claudie Revering M.D.   On: 03/20/2019 09:28    ____________________________________________   PROCEDURES  Procedure(s) performed (including Critical Care):  Procedures   ____________________________________________   INITIAL IMPRESSION / ASSESSMENT AND PLAN / ED COURSE  As part of my medical decision making, I reviewed the following data within the [place]         Patient presents with 1 month of right flank and intermittent pelvic pain.  Patient CT is remarkable for hydronephrosis and renal calculus.  Patient given discharge care instruction will follow urology for definitive evaluation and treatment.      ____________________________________________   FINAL CLINICAL IMPRESSION(S) / ED DIAGNOSES  Final diagnoses:  Kidney  stones     ED Discharge Orders         Ordered    oxyCODONE-acetaminophen (PERCOCET) 7.5-325 MG tablet  Every 6 hours PRN     03/20/19 1238           Note:  This document was prepared using Dragon voice recognition software and may include unintentional dictation errors.    Sable Feil, PA-C 03/20/19 1244    Earleen Newport, MD 03/20/19 1249

## 2019-03-20 NOTE — ED Notes (Signed)
See triage note  Presents with lower back pain

## 2019-03-20 NOTE — Discharge Instructions (Addendum)
Follow discharge care instruction.  Take pain medication as needed.  Follow-up with urology for definitive evaluation and treatment.

## 2019-03-20 NOTE — ED Triage Notes (Signed)
Pt c/o pelvic and lower back pain for the past month, states she had an evisit and another visit with negative urine test. States she did have blood in her urine but that cleared up. States she is having some nausea and diarrhea today.

## 2019-03-21 LAB — URINE CULTURE

## 2019-03-22 NOTE — Progress Notes (Signed)
Pls let pt know C&S neg. Is she still having sx and if so, what are they?

## 2019-03-23 NOTE — Progress Notes (Signed)
I saw that. Glad she has appt.

## 2019-03-24 ENCOUNTER — Ambulatory Visit
Admission: RE | Admit: 2019-03-24 | Discharge: 2019-03-24 | Disposition: A | Discharge status: Home / Self Care | Payer: BC Managed Care – PPO | Attending: Urology | Admitting: Urology

## 2019-03-24 ENCOUNTER — Ambulatory Visit (INDEPENDENT_AMBULATORY_CARE_PROVIDER_SITE_OTHER): Payer: BC Managed Care – PPO | Admitting: Urology

## 2019-03-24 ENCOUNTER — Other Ambulatory Visit: Payer: Self-pay

## 2019-03-24 ENCOUNTER — Other Ambulatory Visit
Admission: RE | Admit: 2019-03-24 | Discharge: 2019-03-24 | Disposition: A | Discharge status: Home / Self Care | Payer: BC Managed Care – PPO | Source: Home / Self Care | Attending: Urology | Admitting: Urology

## 2019-03-24 ENCOUNTER — Telehealth: Payer: Self-pay

## 2019-03-24 ENCOUNTER — Encounter: Payer: Self-pay | Admitting: Urology

## 2019-03-24 ENCOUNTER — Ambulatory Visit
Admission: RE | Admit: 2019-03-24 | Discharge: 2019-03-24 | Disposition: A | Discharge status: Home / Self Care | Payer: BC Managed Care – PPO | Source: Ambulatory Visit | Attending: Urology | Admitting: Urology

## 2019-03-24 VITALS — BP 121/69 | HR 72 | Ht 63.0 in | Wt 151.0 lb

## 2019-03-24 DIAGNOSIS — N200 Calculus of kidney: Secondary | ICD-10-CM

## 2019-03-24 DIAGNOSIS — Z20822 Contact with and (suspected) exposure to covid-19: Secondary | ICD-10-CM

## 2019-03-24 LAB — URINALYSIS, COMPLETE (UACMP) WITH MICROSCOPIC
Bilirubin Urine: NEGATIVE
Glucose, UA: NEGATIVE mg/dL
Hgb urine dipstick: NEGATIVE
Ketones, ur: NEGATIVE mg/dL
Leukocytes,Ua: NEGATIVE
Nitrite: NEGATIVE
Protein, ur: NEGATIVE mg/dL
RBC / HPF: NONE SEEN RBC/hpf (ref 0–5)
Specific Gravity, Urine: 1.02 (ref 1.005–1.030)
pH: 6 (ref 5.0–8.0)

## 2019-03-24 NOTE — Patient Instructions (Signed)
Ureteral Stent Implantation  Ureteral stent implantation is a procedure to insert (implant) a flexible, soft, plastic tube (stent) into a ureter. Ureters are the tube-like parts of the body that drain urine from the kidneys. The stent supports the ureter while it heals and helps to drain urine. You may have a ureteral stent implanted after having a procedure to remove a blockage from the ureter (ureterolysis or pyeloplasty). You may also have a stent implanted to open the flow of urine when you have a blockage caused by a kidney stone, tumor, blood clot, or infection. You have two ureters, one on each side of the body. The ureters connect the kidneys to the organ that holds urine until it passes out of the body (bladder). The stent is placed so that one end is in the kidney, and one end is in the bladder. The stent is usually taken out after your ureter has healed. Depending on your condition, you may have a stent for just a few weeks, or you may have a long-term stent that will need to be replaced every few months. Tell a health care provider about:  Any allergies you have.  All medicines you are taking, including vitamins, herbs, eye drops, creams, and over-the-counter medicines.  Any problems you or family members have had with anesthetic medicines.  Any blood disorders you have.  Any surgeries you have had.  Any medical conditions you have.  Whether you are pregnant or may be pregnant. What are the risks? Generally, this is a safe procedure. However, problems may occur, including:  Infection.  Bleeding.  Allergic reactions to medicines.  Damage to other structures or organs. Tearing (perforation) of the ureter is possible.  Movement of the stent away from where it is placed during surgery (migration). What happens before the procedure? Medicines Ask your health care provider about:  Changing or stopping your regular medicines. This is especially important if you are taking  diabetes medicines or blood thinners.  Taking medicines such as aspirin and ibuprofen. These medicines can thin your blood. Do not take these medicines unless your health care provider tells you to take them.  Taking over-the-counter medicines, vitamins, herbs, and supplements. Eating and drinking Follow instructions from your health care provider about eating and drinking, which may include:  8 hours before the procedure - stop eating heavy meals or foods, such as meat, fried foods, or fatty foods.  6 hours before the procedure - stop eating light meals or foods, such as toast or cereal.  6 hours before the procedure - stop drinking milk or drinks that contain milk.  2 hours before the procedure - stop drinking clear liquids. Staying hydrated Follow instructions from your health care provider about hydration, which may include:  Up to 2 hours before the procedure - you may continue to drink clear liquids, such as water, clear fruit juice, black coffee, and plain tea. General instructions  Do not drink alcohol.  Do not use any products that contain nicotine or tobacco for at least 4 weeks before the procedure. These products include cigarettes, e-cigarettes, and chewing tobacco. If you need help quitting, ask your health care provider.  You may have an exam or testing, such as imaging or blood tests.  Ask your health care provider what steps will be taken to help prevent infection. These may include: ? Removing hair at the surgery site. ? Washing skin with a germ-killing soap. ? Taking antibiotic medicine.  Plan to have someone take you home   from the hospital or clinic.  If you will be going home right after the procedure, plan to have someone with you for 24 hours. What happens during the procedure?  An IV will be inserted into one of your veins.  You may be given a medicine to help you relax (sedative).  You may be given a medicine to make you fall asleep (general  anesthetic).  A thin, tube-shaped instrument with a light and tiny camera at the end (cystoscope) will be inserted into your urethra. The urethra is the tube that drains urine from the bladder out of the body. In men, the urethra opens at the end of the penis. In women, the urethra opens in front of the vaginal opening.  The cystoscope will be passed into your bladder.  A thin wire (guide wire) will be passed through your bladder and into your ureter. This is used to guide the stent into your ureter.  The stent will be inserted into your ureter.  The guide wire and the cystoscope will be removed.  A flexible tube (catheter) may be inserted through your urethra so that one end is in your bladder. This helps to drain urine from your bladder. The procedure may vary among hospitals and health care providers. What happens after the procedure?  Your blood pressure, heart rate, breathing rate, and blood oxygen level will be monitored until you leave the hospital or clinic.  You may continue to receive medicine and fluids through an IV.  You may have some soreness or pain in your abdomen and urethra. Medicines will be available to help you.  You will be encouraged to get up and walk around as soon as you can.  You may have a catheter draining your urine.  You will have some blood in your urine.  Do not drive for 24 hours if you were given a sedative during your procedure. Summary  Ureteral stent implantation is a procedure to insert a flexible, soft, plastic tube (stent) into a ureter.  You may have a stent implanted to support the ureter while it heals after a procedure or to open the flow of urine if there is a blockage.  Follow instructions from your health care provider about taking medicines and about eating and drinking before the procedure.  Depending on your condition, you may have a stent for just a few weeks, or you may have a long-term stent that will need to be replaced every  few months. This information is not intended to replace advice given to you by your health care provider. Make sure you discuss any questions you have with your health care provider. Document Released: 04/27/2000 Document Revised: 02/04/2018 Document Reviewed: 02/05/2018 Elsevier Patient Education  2020 [employer] Laser Therapy for Kidney Stones Laser therapy for kidney stones is a procedure to break up small, hard mineral deposits that form in the kidney (kidney stones). The procedure is done using a device that produces a focused beam of light (laser). The laser breaks up kidney stones into pieces that are small enough to be passed out of the body through urination or removed from the body during the procedure. You may need laser therapy if you have kidney stones that are painful or block your urinary tract. This procedure is done by inserting a tube (ureteroscope) into your kidney through the urethral opening. The urethra is the part of the body that drains urine from the bladder. In women, the urethra opens above the vaginal opening. In men,  the urethra opens at the tip of the penis. The ureteroscope is inserted through the urethra, and surgical instruments are moved through the bladder and the muscular tube that connects the kidney to the bladder (ureter) until they reach the kidney. Tell a health care provider about:  Any allergies you have.  All medicines you are taking, including vitamins, herbs, eye drops, creams, and over-the-counter medicines.  Any problems you or family members have had with anesthetic medicines.  Any blood disorders you have.  Any surgeries you have had.  Any medical conditions you have.  Whether you are pregnant or may be pregnant. What are the risks? Generally, this is a safe procedure. However, problems may occur, including:  Infection.  Bleeding.  Allergic reactions to medicines.  Damage to the urethra, bladder, or ureter.  Urinary tract  infection (UTI).  Narrowing of the urethra (urethral stricture).  Difficulty passing urine.  Blockage of the kidney caused by a fragment of kidney stone. What happens before the procedure? Medicines  Ask your health care provider about: ? Changing or stopping your regular medicines. This is especially important if you are taking diabetes medicines or blood thinners. ? Taking medicines such as aspirin and ibuprofen. These medicines can thin your blood. Do not take these medicines unless your health care provider tells you to take them. ? Taking over-the-counter medicines, vitamins, herbs, and supplements. Eating and drinking Follow instructions from your health care provider about eating and drinking, which may include:  8 hours before the procedure - stop eating heavy meals or foods, such as meat, fried foods, or fatty foods.  6 hours before the procedure - stop eating light meals or foods, such as toast or cereal.  6 hours before the procedure - stop drinking milk or drinks that contain milk.  2 hours before the procedure - stop drinking clear liquids. Staying hydrated Follow instructions from your health care provider about hydration, which may include:  Up to 2 hours before the procedure - you may continue to drink clear liquids, such as water, clear fruit juice, black coffee, and plain tea.  General instructions  You may have a physical exam before the procedure. You may also have tests, such as imaging tests and blood or urine tests.  If your ureter is too narrow, your health care provider may place a soft, flexible tube (stent) inside of it. The stent may be placed days or weeks before your laser therapy procedure.  Plan to have someone take you home from the hospital or clinic.  If you will be going home right after the procedure, plan to have someone stay with you for 24 hours.  Do not use any products that contain nicotine or tobacco for at least 4 weeks before the  procedure. These products include cigarettes, e-cigarettes, and chewing tobacco. If you need help quitting, ask your health care provider.  Ask your health care provider: ? How your surgical site will be marked or identified. ? What steps will be taken to help prevent infection. These may include:  Removing hair at the surgery site.  Washing skin with a germ-killing soap.  Taking antibiotic medicine. What happens during the procedure?   An IV will be inserted into one of your veins.  You will be given one or more of the following: ? A medicine to help you relax (sedative). ? A medicine to numb the area (local anesthetic). ? A medicine to make you fall asleep (general anesthetic).  A ureteroscope will be  inserted into your urethra. The ureteroscope will send images to a video screen in the operating room to guide your surgeon to the area of your kidney that will be treated.  A small, flexible tube will be threaded through the ureteroscope and into your bladder and ureter, up to your kidney.  The laser device will be inserted into your kidney through the tube. Your surgeon will pulse the laser on and off to break up kidney stones.  A surgical instrument that has a tiny wire basket may be inserted through the tube into your kidney to remove the pieces of broken kidney stone. The procedure may vary among health care providers and hospitals. What happens after the procedure?  Your blood pressure, heart rate, breathing rate, and blood oxygen level will be monitored until you leave the hospital or clinic.  You will be given pain medicine as needed.  You may continue to receive antibiotics.  You may have a stent temporarily placed in your ureter.  Do not drive for 24 hours if you were given a sedative during your procedure.  You may be given a strainer to collect any stone fragments that you pass in your urine. Your health care provider may have these tested. Summary  Laser  therapy for kidney stones is a procedure to break up kidney stones into pieces that are small enough to be passed out of the body through urination or removed during the procedure.  Follow instructions from your health care provider about eating and drinking before the procedure.  During the procedure, the ureteroscope will send images to a video screen to guide your surgeon to the area of your kidney that will be treated.  Do not drive for 24 hours if you were given a sedative during your procedure. This information is not intended to replace advice given to you by your health care provider. Make sure you discuss any questions you have with your health care provider. Document Released: 05/27/2015 Document Revised: 01/09/2018 Document Reviewed: 01/09/2018 Elsevier Patient Education  2020 [employer]

## 2019-03-24 NOTE — Progress Notes (Signed)
03/24/19 1:52 PM   Vern Claude Apr 17, 1971 [id number]  Referring provider: Chad Cordial, PA-C [address] [place],  [place] [address]  CC: Kidney stone  HPI: I saw Ms. Chauncey to clinic for evaluation of right-sided kidney stone.  She is a healthy 48 year old female with no prior history of nephrolithiasis who reports a month of intermittent pelvic pain, right-sided flank pain, urgency, and frequency.  She was originally worked up with urine culture by her GYN on 02/16/2019 and 03/19/2019 that showed only mixed flora.  She ultimately presented to the ED on 03/20/2019 with worsening right-sided low back pain and CT scan showed a 9 mm right distal ureteral stone with moderate hydroureteronephrosis, as well as a 9 mm upper pole right renal stone.  Urinalysis, BMP, and CBC were all benign, and she was discharged with pain control and urology follow-up.  She reports she does not have any flank pain over the last 36 hours, but continues to have urinary frequency and intermittent pelvic discomfort.  She denies any dysuria, fevers, or chills.  Urinalysis today benign with 0-5 WBCs, 0 RBCs, no bacteria.  PMH: Past Medical History:  Diagnosis Date  . Anxiety   . BRCA negative 11/2014   MyRisk negative  . Depression   . Family history of ovarian cancer   . History of prescription drug abuse 2003  . Kidney stone   . Vitamin D deficiency     Surgical History: Past Surgical History:  Procedure Laterality Date  . CESAREAN SECTION    . CHOLECYSTECTOMY  1997  . INTRAUTERINE DEVICE (IUD) INSERTION      Allergies:  Allergies  Allergen Reactions  . Sulfa Antibiotics Rash    Family History: Family History  Problem Relation Age of Onset  . Hypertension Mother   . Colon cancer Maternal Grandfather 90  . Ovarian cancer Paternal Grandmother 72  . Ovarian cancer Other 34  . Breast cancer Neg Hx     Social History:  reports that she has never smoked. She has never used  smokeless tobacco. She reports current alcohol use. She reports that she does not use drugs.  ROS: Please see flowsheet from today's date for complete review of systems.  Physical Exam: BP 121/69 (BP Location: Left Arm, Patient Position: Sitting, Cuff Size: Normal)   Pulse 72   Ht 5' 3"  (1.6 m)   Wt 151 lb (68.5 kg)   BMI 26.75 kg/m    Constitutional:  Alert and oriented, No acute distress. Cardiovascular: No clubbing, cyanosis, or edema. Respiratory: Normal respiratory effort, no increased work of breathing. GI: Abdomen is soft, nontender, nondistended, no abdominal masses GU: No CVA tenderness Lymph: No cervical or inguinal lymphadenopathy. Skin: No rashes, bruises or suspicious lesions. Neurologic: Grossly intact, no focal deficits, moving all 4 extremities. Psychiatric: Normal mood and affect.  Laboratory Data: See HPI  Pertinent Imaging: See HPI  Assessment & Plan:   In summary, the patient is a 48 year old healthy female with intermittent renal colic and pelvic pain secondary to a 9 mm right distal ureteral stone, with an additional 9 mm right upper pole stone.  Urinalysis shows no signs of infection.  We discussed various treatment options for urolithiasis including observation with or without medical expulsive therapy, shockwave lithotripsy (SWL), ureteroscopy and laser lithotripsy with stent placement, and percutaneous nephrolithotomy.  We discussed that management is based on stone size, location, density, patient co-morbidities, and patient preference.   Stones <1m in size have a >80% spontaneous passage rate. Data  surrounding the use of tamsulosin for medical expulsive therapy is controversial, but meta analyses suggests it is most efficacious for distal stones between 5-41m in size. Possible side effects include dizziness/lightheadedness, and retrograde ejaculation.  SWL has a lower stone free rate in a single procedure, but also a lower complication rate compared  to ureteroscopy and avoids a stent and associated stent related symptoms. Possible complications include renal hematoma, steinstrasse, and need for additional treatment.  Ureteroscopy with laser lithotripsy and stent placement has a higher stone free rate than SWL in a single procedure, however increased complication rate including possible infection, ureteral injury, bleeding, and stent related morbidity. Common stent related symptoms include dysuria, urgency/frequency, and flank pain.  I recommended proceeding with ureteroscopy in the setting of her dense stone with >1000HU, and 2 distinct stones in different locations that can both be treated simultaneously with ureteroscopy.  She is in agreement.  -KUB today to confirm stone presents with absence of microscopic hematuria and pain resolved over the last 36+ hours -Plan for right ureteroscopy, laser lithotripsy, stent placement later this week if stone present on KUB today  A total of 30 minutes were spent face-to-face with the patient, greater than 50% was spent in patient education, counseling, and coordination of care regarding nephrolithiasis and stone treatment options.   [facility] [address] [place] [phone]

## 2019-03-24 NOTE — Telephone Encounter (Signed)
can u call ms Kulesza from earlier this afternoon and let her know stone is still present on xray today- Thx. Amy is working on setting her up for ureteroscopy/laser lithotripsy on Thursday at [facility] pt informed her of the information above. Pt gave verbal understanding and states that she has already spoken with Amy.

## 2019-03-25 ENCOUNTER — Other Ambulatory Visit: Payer: Self-pay | Admitting: Radiology

## 2019-03-25 ENCOUNTER — Other Ambulatory Visit
Admission: RE | Admit: 2019-03-25 | Discharge: 2019-03-25 | Disposition: A | Discharge status: Home / Self Care | Payer: BC Managed Care – PPO | Source: Ambulatory Visit | Attending: Urology | Admitting: Urology

## 2019-03-25 DIAGNOSIS — Z20828 Contact with and (suspected) exposure to other viral communicable diseases: Secondary | ICD-10-CM | POA: Insufficient documentation

## 2019-03-25 DIAGNOSIS — N201 Calculus of ureter: Secondary | ICD-10-CM

## 2019-03-25 DIAGNOSIS — Z01812 Encounter for preprocedural laboratory examination: Secondary | ICD-10-CM | POA: Insufficient documentation

## 2019-03-25 HISTORY — DX: Personal history of urinary calculi: Z87.442

## 2019-03-25 NOTE — Patient Instructions (Signed)
Your procedure is scheduled on: March 26, 2019 THURSDAY Report to Day Surgery on the 2nd floor of the [facility]. To find out your arrival time, please call [phone] between 1PM - 3PM on: PT [place] AT 3 PM  REMEMBER: Instructions that are not followed completely may result in serious medical risk, up to and including death; or upon the discretion of your surgeon and anesthesiologist your surgery may need to be rescheduled.  Do not eat food after midnight the night before surgery.  No gum chewing, lozengers or hard candies.  You may however, drink CLEAR liquids up to 2 hours before you are scheduled to arrive for your surgery. Do not drink anything within 2 hours of the start of your surgery.  Clear liquids include: - water  - apple juice without pulp - gatorade - black coffee or tea (Do NOT add milk or creamers to the coffee or tea) Do NOT drink anything that is not on this list.  Type 1 and Type 2 diabetics should only drink water.  No Alcohol for 24 hours before or after surgery.  No Smoking including e-cigarettes for 24 hours prior to surgery.  No chewable tobacco products for at least 6 hours prior to surgery.  No nicotine patches on the day of surgery.  On the morning of surgery brush your teeth with toothpaste and water, you may rinse your mouth with mouthwash if you wish. Do not swallow any toothpaste or mouthwash.  Notify your doctor if there is any change in your medical condition (cold, fever, infection).  Do not wear jewelry, make-up, hairpins, clips or nail polish.  Do not wear lotions, powders, or perfumes.   Do not shave 48 hours prior to surgery.   Contacts and dentures may not be worn into surgery.  Do not bring valuables to the hospital, including drivers license, insurance or credit cards.  [facility] is not responsible for any belongings or valuables.   TAKE THESE MEDICATIONS THE MORNING OF SURGERY: NONE   TAKE A  SHOWER THE DAY OF SURGERY.  Follow recommendations from Cardiologist, Pulmonologist or PCP regarding stopping Aspirin, Coumadin, Plavix, Eliquis, Pradaxa, or Pletal.  Stop Anti-inflammatories (NSAIDS) such as Advil, Aleve, Ibuprofen, Motrin, Naproxen, Naprosyn and Aspirin based products such as Excedrin, Goodys Powder, BC Powder. (May take Tylenol or Acetaminophen if needed.)  Stop ANY OVER THE COUNTER supplements until after surgery. (May continue Vitamin D, Vitamin B, and multivitamin.)  Wear comfortable clothing (specific to your surgery type) to the hospital.  Plan for stool softeners for home use.  If you are being discharged the day of surgery, you will not be allowed to drive home. You will need a responsible adult to drive you home and stay with you that night.   If you are taking public transportation, you will need to have a responsible adult with you. Please confirm with your physician that it is acceptable to use public transportation.   Please call [phone] if you have any questions about these instructions.

## 2019-03-26 ENCOUNTER — Encounter
Admission: RE | Disposition: A | Discharge status: Home / Self Care | Payer: Self-pay | Source: Home / Self Care | Attending: Urology

## 2019-03-26 ENCOUNTER — Ambulatory Visit: Payer: BC Managed Care – PPO | Admitting: Anesthesiology

## 2019-03-26 ENCOUNTER — Ambulatory Visit
Admission: RE | Admit: 2019-03-26 | Discharge: 2019-03-26 | Disposition: A | Discharge status: Home / Self Care | Payer: BC Managed Care – PPO | Attending: Urology | Admitting: Urology

## 2019-03-26 ENCOUNTER — Encounter: Payer: Self-pay | Admitting: *Deleted

## 2019-03-26 ENCOUNTER — Ambulatory Visit: Payer: BC Managed Care – PPO

## 2019-03-26 DIAGNOSIS — N201 Calculus of ureter: Secondary | ICD-10-CM | POA: Diagnosis not present

## 2019-03-26 DIAGNOSIS — Z882 Allergy status to sulfonamides status: Secondary | ICD-10-CM | POA: Insufficient documentation

## 2019-03-26 DIAGNOSIS — N200 Calculus of kidney: Secondary | ICD-10-CM | POA: Diagnosis not present

## 2019-03-26 HISTORY — PX: CYSTOSCOPY/URETEROSCOPY/HOLMIUM LASER/STENT PLACEMENT: SHX6546

## 2019-03-26 HISTORY — PX: CYSTOSCOPY W/ RETROGRADES: SHX1426

## 2019-03-26 LAB — SARS CORONAVIRUS 2 (TAT 6-24 HRS): SARS Coronavirus 2: NEGATIVE

## 2019-03-26 LAB — NOVEL CORONAVIRUS, NAA: SARS-CoV-2, NAA: NOT DETECTED

## 2019-03-26 LAB — POCT PREGNANCY, URINE: Preg Test, Ur: NEGATIVE

## 2019-03-26 SURGERY — CYSTOSCOPY/URETEROSCOPY/HOLMIUM LASER/STENT PLACEMENT
Anesthesia: General | Site: Ureter | Laterality: Right

## 2019-03-26 MED ORDER — BELLADONNA ALKALOIDS-OPIUM 16.2-60 MG RE SUPP
RECTAL | Status: DC | PRN
  Administered 2019-03-26: 1 via RECTAL

## 2019-03-26 MED ORDER — DEXAMETHASONE SODIUM PHOSPHATE 10 MG/ML IJ SOLN
INTRAMUSCULAR | Status: DC | PRN
  Administered 2019-03-26: 10 mg via INTRAVENOUS

## 2019-03-26 MED ORDER — FAMOTIDINE 20 MG PO TABS
ORAL_TABLET | ORAL | Status: AC
  Administered 2019-03-26: 20 mg via ORAL
  Filled 2019-03-26: qty 1

## 2019-03-26 MED ORDER — OXYCODONE HCL 5 MG/5ML PO SOLN: 5.0000 mg | Freq: Once | ORAL | Status: AC | PRN

## 2019-03-26 MED ORDER — ONDANSETRON HCL 4 MG/2ML IJ SOLN
INTRAMUSCULAR | Status: DC | PRN
  Administered 2019-03-26: 4 mg via INTRAVENOUS

## 2019-03-26 MED ORDER — FENTANYL CITRATE (PF) 100 MCG/2ML IJ SOLN
INTRAMUSCULAR | Status: AC
  Administered 2019-03-26: 25 ug via INTRAVENOUS
  Filled 2019-03-26: qty 2

## 2019-03-26 MED ORDER — MIDAZOLAM HCL 2 MG/2ML IJ SOLN
INTRAMUSCULAR | Status: DC | PRN
  Administered 2019-03-26: 2 mg via INTRAVENOUS

## 2019-03-26 MED ORDER — LACTATED RINGERS IV SOLN
INTRAVENOUS | Status: DC
  Administered 2019-03-26: 15:00:00 via INTRAVENOUS

## 2019-03-26 MED ORDER — ACETAMINOPHEN 10 MG/ML IV SOLN
INTRAVENOUS | Status: DC | PRN
  Administered 2019-03-26: 1000 mg via INTRAVENOUS

## 2019-03-26 MED ORDER — TAMSULOSIN HCL 0.4 MG PO CAPS: 0.4000 mg | ORAL_CAPSULE | Freq: Every day | ORAL | 0 refills | Status: DC

## 2019-03-26 MED ORDER — SUGAMMADEX SODIUM 200 MG/2ML IV SOLN
INTRAVENOUS | Status: DC | PRN
  Administered 2019-03-26: 200 mg via INTRAVENOUS

## 2019-03-26 MED ORDER — PROPOFOL 10 MG/ML IV BOLUS
INTRAVENOUS | Status: AC
  Filled 2019-03-26: qty 20

## 2019-03-26 MED ORDER — PROPOFOL 10 MG/ML IV BOLUS
INTRAVENOUS | Status: DC | PRN
  Administered 2019-03-26: 150 mg via INTRAVENOUS

## 2019-03-26 MED ORDER — FAMOTIDINE 20 MG PO TABS
20.0000 mg | ORAL_TABLET | Freq: Once | ORAL | Status: AC
  Administered 2019-03-26: 15:00:00 20 mg via ORAL

## 2019-03-26 MED ORDER — KETOROLAC TROMETHAMINE 30 MG/ML IJ SOLN
INTRAMUSCULAR | Status: DC | PRN
  Administered 2019-03-26: 15 mg via INTRAVENOUS

## 2019-03-26 MED ORDER — OXYCODONE HCL 5 MG PO TABS
ORAL_TABLET | ORAL | Status: AC
  Administered 2019-03-26: 5 mg via ORAL
  Filled 2019-03-26: qty 1

## 2019-03-26 MED ORDER — FENTANYL CITRATE (PF) 100 MCG/2ML IJ SOLN
INTRAMUSCULAR | Status: AC
  Filled 2019-03-26: qty 2

## 2019-03-26 MED ORDER — LIDOCAINE HCL (CARDIAC) PF 100 MG/5ML IV SOSY
PREFILLED_SYRINGE | INTRAVENOUS | Status: DC | PRN
  Administered 2019-03-26: 100 mg via INTRAVENOUS

## 2019-03-26 MED ORDER — FENTANYL CITRATE (PF) 100 MCG/2ML IJ SOLN
25.0000 ug | INTRAMUSCULAR | Status: AC | PRN
  Administered 2019-03-26 (×6): 25 ug via INTRAVENOUS

## 2019-03-26 MED ORDER — CEFAZOLIN SODIUM-DEXTROSE 2-4 GM/100ML-% IV SOLN
2.0000 g | INTRAVENOUS | Status: AC
  Administered 2019-03-26: 16:00:00 2 g via INTRAVENOUS

## 2019-03-26 MED ORDER — OXYCODONE HCL 5 MG PO TABS
5.0000 mg | ORAL_TABLET | Freq: Once | ORAL | Status: AC | PRN
  Administered 2019-03-26: 18:00:00 5 mg via ORAL

## 2019-03-26 MED ORDER — ACETAMINOPHEN 10 MG/ML IV SOLN
INTRAVENOUS | Status: AC
  Filled 2019-03-26: qty 100

## 2019-03-26 MED ORDER — IOHEXOL 180 MG/ML  SOLN
INTRAMUSCULAR | Status: DC | PRN
  Administered 2019-03-26: 16:00:00 40 mL

## 2019-03-26 MED ORDER — ROCURONIUM BROMIDE 100 MG/10ML IV SOLN
INTRAVENOUS | Status: DC | PRN
  Administered 2019-03-26: 50 mg via INTRAVENOUS

## 2019-03-26 MED ORDER — MIDAZOLAM HCL 2 MG/2ML IJ SOLN
INTRAMUSCULAR | Status: AC
  Filled 2019-03-26: qty 2

## 2019-03-26 MED ORDER — DEXAMETHASONE SODIUM PHOSPHATE 10 MG/ML IJ SOLN
INTRAMUSCULAR | Status: AC
  Filled 2019-03-26: qty 1

## 2019-03-26 MED ORDER — ONDANSETRON HCL 4 MG/2ML IJ SOLN
INTRAMUSCULAR | Status: AC
  Filled 2019-03-26: qty 2

## 2019-03-26 MED ORDER — CEFAZOLIN SODIUM-DEXTROSE 2-4 GM/100ML-% IV SOLN
INTRAVENOUS | Status: AC
  Filled 2019-03-26: qty 100

## 2019-03-26 MED ORDER — SEVOFLURANE IN SOLN
RESPIRATORY_TRACT | Status: AC
  Filled 2019-03-26: qty 250

## 2019-03-26 MED ORDER — BELLADONNA ALKALOIDS-OPIUM 16.2-60 MG RE SUPP
RECTAL | Status: AC
  Filled 2019-03-26: qty 1

## 2019-03-26 MED ORDER — FENTANYL CITRATE (PF) 100 MCG/2ML IJ SOLN
INTRAMUSCULAR | Status: DC | PRN
  Administered 2019-03-26 (×2): 50 ug via INTRAVENOUS

## 2019-03-26 MED ORDER — HYDROCODONE-ACETAMINOPHEN 5-325 MG PO TABS: 1.0000 | ORAL_TABLET | ORAL | 0 refills | Status: AC | PRN

## 2019-03-26 SURGICAL SUPPLY — 29 items
BAG DRAIN CYSTO-URO LG1000N (MISCELLANEOUS) ×3 IMPLANT
BRUSH SCRUB EZ 1% IODOPHOR (MISCELLANEOUS) ×3 IMPLANT
CATH URETL 5X70 OPEN END (CATHETERS) ×3 IMPLANT
CNTNR SPEC 2.5X3XGRAD LEK (MISCELLANEOUS)
CONT SPEC 4OZ STER OR WHT (MISCELLANEOUS)
CONTAINER SPEC 2.5X3XGRAD LEK (MISCELLANEOUS) IMPLANT
DRAPE UTILITY 15X26 TOWEL STRL (DRAPES) ×3 IMPLANT
FIBER LASER TRAC TIP (UROLOGICAL SUPPLIES) ×3 IMPLANT
GLOVE BIOGEL PI IND STRL 7.5 (GLOVE) ×1 IMPLANT
GLOVE BIOGEL PI INDICATOR 7.5 (GLOVE) ×2
GOWN STRL REUS W/ TWL LRG LVL3 (GOWN DISPOSABLE) ×1 IMPLANT
GOWN STRL REUS W/ TWL XL LVL3 (GOWN DISPOSABLE) ×1 IMPLANT
GOWN STRL REUS W/TWL LRG LVL3 (GOWN DISPOSABLE) ×2
GOWN STRL REUS W/TWL XL LVL3 (GOWN DISPOSABLE) ×2
GUIDEWIRE STR DUAL SENSOR (WIRE) ×6 IMPLANT
INFUSOR MANOMETER BAG 3000ML (MISCELLANEOUS) ×3 IMPLANT
INTRODUCER DILATOR DOUBLE (INTRODUCER) ×3 IMPLANT
KIT TURNOVER CYSTO (KITS) ×3 IMPLANT
PACK CYSTO AR (MISCELLANEOUS) ×3 IMPLANT
SET CYSTO W/LG BORE CLAMP LF (SET/KITS/TRAYS/PACK) ×3 IMPLANT
SHEATH URETERAL 12FRX35CM (MISCELLANEOUS) IMPLANT
SOL .9 NS 3000ML IRR  AL (IV SOLUTION) ×2
SOL .9 NS 3000ML IRR UROMATIC (IV SOLUTION) ×1 IMPLANT
STENT URET 6FRX24 CONTOUR (STENTS) ×3 IMPLANT
STENT URET 6FRX26 CONTOUR (STENTS) IMPLANT
SURGILUBE 2OZ TUBE FLIPTOP (MISCELLANEOUS) ×3 IMPLANT
SYR 10ML LL (SYRINGE) ×3 IMPLANT
VALVE UROSEAL ADJ ENDO (VALVE) ×3 IMPLANT
WATER STERILE IRR 1000ML POUR (IV SOLUTION) ×3 IMPLANT

## 2019-03-26 NOTE — Discharge Instructions (Signed)
AMBULATORY SURGERY  °DISCHARGE INSTRUCTIONS ° ° °1) The drugs that you were given will stay in your system until tomorrow so for the next 24 hours you should not: ° °A) Drive an automobile °B) Make any legal decisions °C) Drink any alcoholic beverage ° ° °2) You may resume regular meals tomorrow.  Today it is better to start with liquids and gradually work up to solid foods. ° °You may eat anything you prefer, but it is better to start with liquids, then soup and crackers, and gradually work up to solid foods. ° ° °3) Please notify your doctor immediately if you have any unusual bleeding, trouble breathing, redness and pain at the surgery site, drainage, fever, or pain not relieved by medication. ° ° ° °4) Additional Instructions: ° ° ° ° ° ° ° °Please contact your physician with any problems or Same Day Surgery at [phone], Monday through Friday 6 am to 4 pm, or [facility] Main number at [phone].AMBULATORY SURGERY  °DISCHARGE INSTRUCTIONS ° ° °5) The drugs that you were given will stay in your system until tomorrow so for the next 24 hours you should not: ° °D) Drive an automobile °E) Make any legal decisions °F) Drink any alcoholic beverage ° ° °6) You may resume regular meals tomorrow.  Today it is better to start with liquids and gradually work up to solid foods. ° °You may eat anything you prefer, but it is better to start with liquids, then soup and crackers, and gradually work up to solid foods. ° ° °7) Please notify your doctor immediately if you have any unusual bleeding, trouble breathing, redness and pain at the surgery site, drainage, fever, or pain not relieved by medication. ° ° ° °8) Additional Instructions: ° ° ° ° ° ° ° °Please contact your physician with any problems or Same Day Surgery at [phone], Monday through Friday 6 am to 4 pm, or [place] [facility] [place] Main number at [phone]. °

## 2019-03-26 NOTE — Anesthesia Preprocedure Evaluation (Signed)
Anesthesia Evaluation  Patient identified by MRN, date of birth, ID band Patient awake    Reviewed: Allergy & Precautions, H&P , NPO status , Patient's Chart, lab work & pertinent test results  Airway Mallampati: III  TM Distance: >3 FB Neck ROM: full    Dental  (+) Teeth Intact   Pulmonary neg pulmonary ROS, neg COPD, Not current smoker,           Cardiovascular (-) angina(-) Past MI negative cardio ROS  (-) dysrhythmias      Neuro/Psych PSYCHIATRIC DISORDERS Anxiety Depression negative neurological ROS     GI/Hepatic negative GI ROS, Neg liver ROS,   Endo/Other  negative endocrine ROS  Renal/GU nephrolithiasis     Musculoskeletal   Abdominal   Peds  Hematology negative hematology ROS (+)   Anesthesia Other Findings Past Medical History: No date: Anxiety 11/2014: BRCA negative     Comment:  MyRisk negative No date: Depression No date: Family history of ovarian cancer No date: History of kidney stones 2003: History of prescription drug abuse No date: Kidney stone No date: Vitamin D deficiency  Past Surgical History: No date: CESAREAN SECTION 1997: CHOLECYSTECTOMY No date: INTRAUTERINE DEVICE (IUD) INSERTION No date: LASIK No date: TONSILLECTOMY     Reproductive/Obstetrics negative OB ROS                             Anesthesia Physical Anesthesia Plan  ASA: II  Anesthesia Plan: General ETT   Post-op Pain Management:    Induction:   PONV Risk Score and Plan: Ondansetron, Dexamethasone, Midazolam and Treatment may vary due to age or medical condition  Airway Management Planned:   Additional Equipment:   Intra-op Plan:   Post-operative Plan:   Informed Consent: I have reviewed the patients History and Physical, chart, labs and discussed the procedure including the risks, benefits and alternatives for the proposed anesthesia with the patient or authorized  representative who has indicated his/her understanding and acceptance.     Dental Advisory Given  Plan Discussed with: Anesthesiologist, CRNA and Surgeon  Anesthesia Plan Comments:         Anesthesia Quick Evaluation

## 2019-03-26 NOTE — Anesthesia Post-op Follow-up Note (Signed)
Anesthesia QCDR form completed.        

## 2019-03-26 NOTE — Anesthesia Postprocedure Evaluation (Signed)
Anesthesia Post Note  Patient: Patricia Randolph  Procedure(s) Performed: CYSTOSCOPY/URETEROSCOPY/HOLMIUM LASER/STENT PLACEMENT (Right Ureter) CYSTOSCOPY WITH RETROGRADE PYELOGRAM (Right Ureter)  Patient location during evaluation: PACU Anesthesia Type: General Level of consciousness: awake and alert Pain management: pain level controlled Vital Signs Assessment: post-procedure vital signs reviewed and stable Respiratory status: spontaneous breathing, nonlabored ventilation and respiratory function stable Cardiovascular status: blood pressure returned to baseline and stable Postop Assessment: no apparent nausea or vomiting Anesthetic complications: no     Last Vitals:  Vitals:   03/26/19 1738 03/26/19 1744  BP:  116/79  Pulse: 67 61  Resp: 17 18  Temp:  36.6 C  SpO2: 96% 98%    Last Pain:  Vitals:   03/26/19 1744  TempSrc:   PainSc: [name]

## 2019-03-26 NOTE — Anesthesia Procedure Notes (Addendum)
Procedure Name: Intubation Date/Time: 03/26/2019 3:46 PM Performed by: Johnna Acosta, CRNA Pre-anesthesia Checklist: Patient identified, Patient being monitored, Timeout performed, Emergency Drugs available and Suction available Patient Re-evaluated:Patient Re-evaluated prior to induction Oxygen Delivery Method: Circle system utilized Preoxygenation: Pre-oxygenation with 100% oxygen Induction Type: IV induction Ventilation: Mask ventilation without difficulty Laryngoscope Size: 3 and McGraph Grade View: Grade I Tube type: Oral Tube size: 7.0 mm Number of attempts: 1 Airway Equipment and Method: Stylet and Video-laryngoscopy Placement Confirmation: ETT inserted through vocal cords under direct vision,  positive ETCO2 and breath sounds checked- equal and bilateral Secured at: 21 cm Tube secured with: Tape Dental Injury: Teeth and Oropharynx as per pre-operative assessment

## 2019-03-26 NOTE — Transfer of Care (Signed)
Immediate Anesthesia Transfer of Care Note  Patient: Sheyanne Munley  Procedure(s) Performed: CYSTOSCOPY/URETEROSCOPY/HOLMIUM LASER/STENT PLACEMENT (Right Ureter) CYSTOSCOPY WITH RETROGRADE PYELOGRAM (Right Ureter)  Patient Location: PACU  Anesthesia Type:General  Level of Consciousness: awake, alert  and oriented  Airway & Oxygen Therapy: Patient Spontanous Breathing and Patient connected to face mask oxygen  Post-op Assessment: Report given to RN and Post -op Vital signs reviewed and stable  Post vital signs: Reviewed and stable  Last Vitals:  Vitals Value Taken Time  BP 120/75 03/26/19 1645  Temp 36.4 C 03/26/19 1644  Pulse 101 03/26/19 1645  Resp 17 03/26/19 1645  SpO2 94 % 03/26/19 1645  Vitals shown include unvalidated device data.  Last Pain:  Vitals:   03/26/19 1644  TempSrc:   PainSc: Asleep         Complications: No apparent anesthesia complications

## 2019-03-26 NOTE — H&P (Signed)
UROLOGY H&P UPDATE  Agree with prior H&P dated 03/24/2019.  48 year old female with a large right distal ureteral stone and right renal stone here today for definitive management with ureteroscopy, laser lithotripsy, and stent placement.  Cardiac: RRR Lungs: CTA bilaterally  Laterality: Right Procedure: Ureteroscopy, laser lithotripsy, stent placement  Urine: Urine culture 11/5 <10K mixed flora  We specifically discussed the risks ureteroscopy including bleeding, infection/sepsis, stent related symptoms including flank pain/urgency/frequency/incontinence/dysuria, ureteral injury, inability to access stone, or need for staged or additional procedures.   Billey Co, MD 03/26/2019

## 2019-03-26 NOTE — Op Note (Signed)
Date of procedure: 03/26/19  Preoperative diagnosis:  1. Right distal ureteral stone  Postoperative diagnosis:  1. Same  Procedure: 1. Cystoscopy, right ureteroscopy, right retrograde pyelogram with intraoperative interpretation, laser lithotripsy, right ureteral stent placement  Surgeon: Nickolas Madrid, MD  Anesthesia: General  Complications: None  Intraoperative findings:  1.  Normal cystoscopy, ureteral orifices orthotopic bilaterally 2.  Right distal ureteral stone dusted and irrigated free of ureter 3.  Thorough pyeloscopy showed no intrarenal stones.  Able to see on fluoroscopy and right renal stone is intraparenchymal based on retrograde  EBL: None  Specimens: None  Drains: Right 6 French by 24 cm ureteral stent  Indication: Patricia Randolph is a 48 y.o. patient with ongoing flank pain secondary to a right 8 mm distal ureteral stone.  After reviewing the management options for treatment, they elected to proceed with the above surgical procedure(s). We have discussed the potential benefits and risks of the procedure, side effects of the proposed treatment, the likelihood of the patient achieving the goals of the procedure, and any potential problems that might occur during the procedure or recuperation. Informed consent has been obtained.  Description of procedure:  The patient was taken to the operating room and general anesthesia was induced. SCDs were placed for DVT prophylaxis. The patient was placed in the dorsal lithotomy position, prepped and draped in the usual sterile fashion, and preoperative antibiotics were administered. A preoperative time-out was performed.   A 21 French rigid cystoscope was used to intubate the urethra.  Thorough cystoscopy demonstrated a normal bladder, and the ureteral orifices were orthotopic bilaterally.  A sensor wire was advanced into the right ureteral orifice and passed easily alongside the stone up into the kidney under fluoroscopic  vision.  I attempted to pass a semirigid ureteroscope into the ureter, but met resistance distally.  A second sensor wire was advanced into the kidney and used to train track the ureter and the scope was passed into the distal ureter.  I immediately identified a yellow 8 mm stone, and this was lasered to dust using the 200 m laser fiber on settings of 1.0 J and 10 Hz.  The fragments were irrigated free from the ureter.  There was some tightness of the right distal ureter, but no ureteral injury.  I then advanced a single-channel flexible ureteroscope over the wire but met resistance distally.  The safety wire was removed, as I felt this was causing the blockage.  The flexible ureteroscope then advanced easily over the wire into the kidney under fluoroscopic vision.  Thorough pyeloscopy revealed no renal stones.  I could clearly see that the calcification corresponding with the CT findings on plain film, but on retrograde this appeared to be intraparenchymal.  There were no other filling defects or extravasation.  The wire was replaced through the scope.  A 6 French by 24 cm ureteral stent was advanced over the wire fluoroscopically with an excellent curl in the upper pole, as well as in the bladder.  The bladder was drained using the obturator and sheath.  A belladonna suppository was placed.  Disposition: Stable to PACU  Plan: Stent removal in clinic in 1 week  Nickolas Madrid, MD

## 2019-03-27 ENCOUNTER — Encounter: Payer: Self-pay | Admitting: Urology

## 2019-03-27 ENCOUNTER — Other Ambulatory Visit: Payer: Self-pay | Admitting: Urology

## 2019-03-27 ENCOUNTER — Other Ambulatory Visit: Payer: Self-pay

## 2019-03-27 DIAGNOSIS — Z960 Presence of urogenital implants: Secondary | ICD-10-CM

## 2019-03-27 MED ORDER — OXYBUTYNIN CHLORIDE 5 MG PO TABS: 5.0000 mg | ORAL_TABLET | Freq: Three times a day (TID) | ORAL | 0 refills | Status: DC | PRN

## 2019-03-27 MED ORDER — HYDROCODONE-ACETAMINOPHEN 5-325 MG PO TABS: 1.0000 | ORAL_TABLET | Freq: Four times a day (QID) | ORAL | 0 refills | Status: AC | PRN

## 2019-03-27 NOTE — Telephone Encounter (Signed)
Pt states that per her discharge notes, and post op nurse she is supposed to have an rx for oxybutynin sent to her pharmacy. Informed pt that I would have rx sent as that is standard protocol for stent placement.

## 2019-03-27 NOTE — Telephone Encounter (Signed)
Azo or uribel ok too if she wants to try something for the dysuria, ok to send in script if she wants  Nickolas Madrid, MD 03/27/2019

## 2019-04-01 ENCOUNTER — Encounter: Payer: Self-pay | Admitting: Urology

## 2019-04-01 ENCOUNTER — Ambulatory Visit (INDEPENDENT_AMBULATORY_CARE_PROVIDER_SITE_OTHER): Payer: BC Managed Care – PPO | Admitting: Urology

## 2019-04-01 ENCOUNTER — Other Ambulatory Visit: Payer: Self-pay

## 2019-04-01 VITALS — BP 115/77 | HR 82 | Ht 62.0 in | Wt 150.4 lb

## 2019-04-01 DIAGNOSIS — Z9889 Other specified postprocedural states: Secondary | ICD-10-CM

## 2019-04-01 DIAGNOSIS — N201 Calculus of ureter: Secondary | ICD-10-CM

## 2019-04-01 LAB — URINALYSIS, COMPLETE
Bilirubin, UA: NEGATIVE
Glucose, UA: NEGATIVE
Nitrite, UA: NEGATIVE
Protein,UA: NEGATIVE
Specific Gravity, UA: 1.015 (ref 1.005–1.030)
Urobilinogen, Ur: 0.2 mg/dL (ref 0.2–1.0)
pH, UA: 6 (ref 5.0–7.5)

## 2019-04-01 LAB — MICROSCOPIC EXAMINATION

## 2019-04-01 MED ORDER — LIDOCAINE HCL URETHRAL/MUCOSAL 2 % EX GEL
1.0000 "application " | Freq: Once | CUTANEOUS | Status: AC
  Administered 2019-04-01: 1 via URETHRAL

## 2019-04-01 NOTE — Progress Notes (Signed)
Indications: Patient is 48 y.o., female status post ureteroscopic removal of an 8 mm right distal ureteral calculus by Dr. Diamantina Providence on 03/26/2019.  She had no postoperative problems. The patient is presenting today for stent removal.  Procedure:  Flexible Cystoscopy with stent removal (48546)  Timeout was performed and the correct patient, procedure and participants were identified.    Description:  The patient was prepped and draped in the usual sterile fashion. Flexible cystosopy was performed.  The stent was visualized, grasped, and removed intact without difficulty. The patient tolerated the procedure well.  A single dose of oral antibiotics was given.  Complications:  None  Plan: She will instructed call for flank pain or fever after stent removal.  Her renal calculi noted on CT were noted intraoperatively to be intraparenchymal. Follow-up 1 year with Dr. Diamantina Providence for KUB.

## 2019-04-03 ENCOUNTER — Encounter: Payer: Self-pay | Admitting: Urology

## 2019-04-30 ENCOUNTER — Other Ambulatory Visit: Payer: Self-pay

## 2019-04-30 ENCOUNTER — Ambulatory Visit: Payer: BC Managed Care – PPO | Attending: Internal Medicine

## 2019-04-30 DIAGNOSIS — Z20822 Contact with and (suspected) exposure to covid-19: Secondary | ICD-10-CM

## 2019-04-30 DIAGNOSIS — Z20828 Contact with and (suspected) exposure to other viral communicable diseases: Secondary | ICD-10-CM | POA: Diagnosis not present

## 2019-05-02 LAB — NOVEL CORONAVIRUS, NAA: SARS-CoV-2, NAA: NOT DETECTED

## 2019-05-11 ENCOUNTER — Other Ambulatory Visit: Payer: BC Managed Care – PPO

## 2019-05-16 ENCOUNTER — Other Ambulatory Visit: Payer: Self-pay | Admitting: Urology

## 2019-05-16 DIAGNOSIS — Z960 Presence of urogenital implants: Secondary | ICD-10-CM

## 2019-05-29 ENCOUNTER — Ambulatory Visit: Payer: BC Managed Care – PPO | Attending: Internal Medicine

## 2019-05-29 DIAGNOSIS — Z20822 Contact with and (suspected) exposure to covid-19: Secondary | ICD-10-CM

## 2019-05-30 LAB — NOVEL CORONAVIRUS, NAA: SARS-CoV-2, NAA: NOT DETECTED

## 2019-06-10 ENCOUNTER — Ambulatory Visit: Payer: BC Managed Care – PPO | Attending: Internal Medicine

## 2019-06-10 DIAGNOSIS — Z20822 Contact with and (suspected) exposure to covid-19: Secondary | ICD-10-CM | POA: Diagnosis not present

## 2019-06-11 LAB — NOVEL CORONAVIRUS, NAA: SARS-CoV-2, NAA: NOT DETECTED

## 2019-06-12 ENCOUNTER — Ambulatory Visit: Payer: BC Managed Care – PPO | Attending: Internal Medicine

## 2019-06-12 DIAGNOSIS — Z20822 Contact with and (suspected) exposure to covid-19: Secondary | ICD-10-CM | POA: Diagnosis not present

## 2019-06-13 LAB — NOVEL CORONAVIRUS, NAA: SARS-CoV-2, NAA: NOT DETECTED

## 2020-01-10 IMAGING — CR DG ABDOMEN 1V
2 series · 2 of 2 positions shown · non-contrast
Comparison: 03/20/2019

CLINICAL DATA: Kidney stone

EXAM:
ABDOMEN - 1 VIEW

[abdomen kub (1 of 2)]
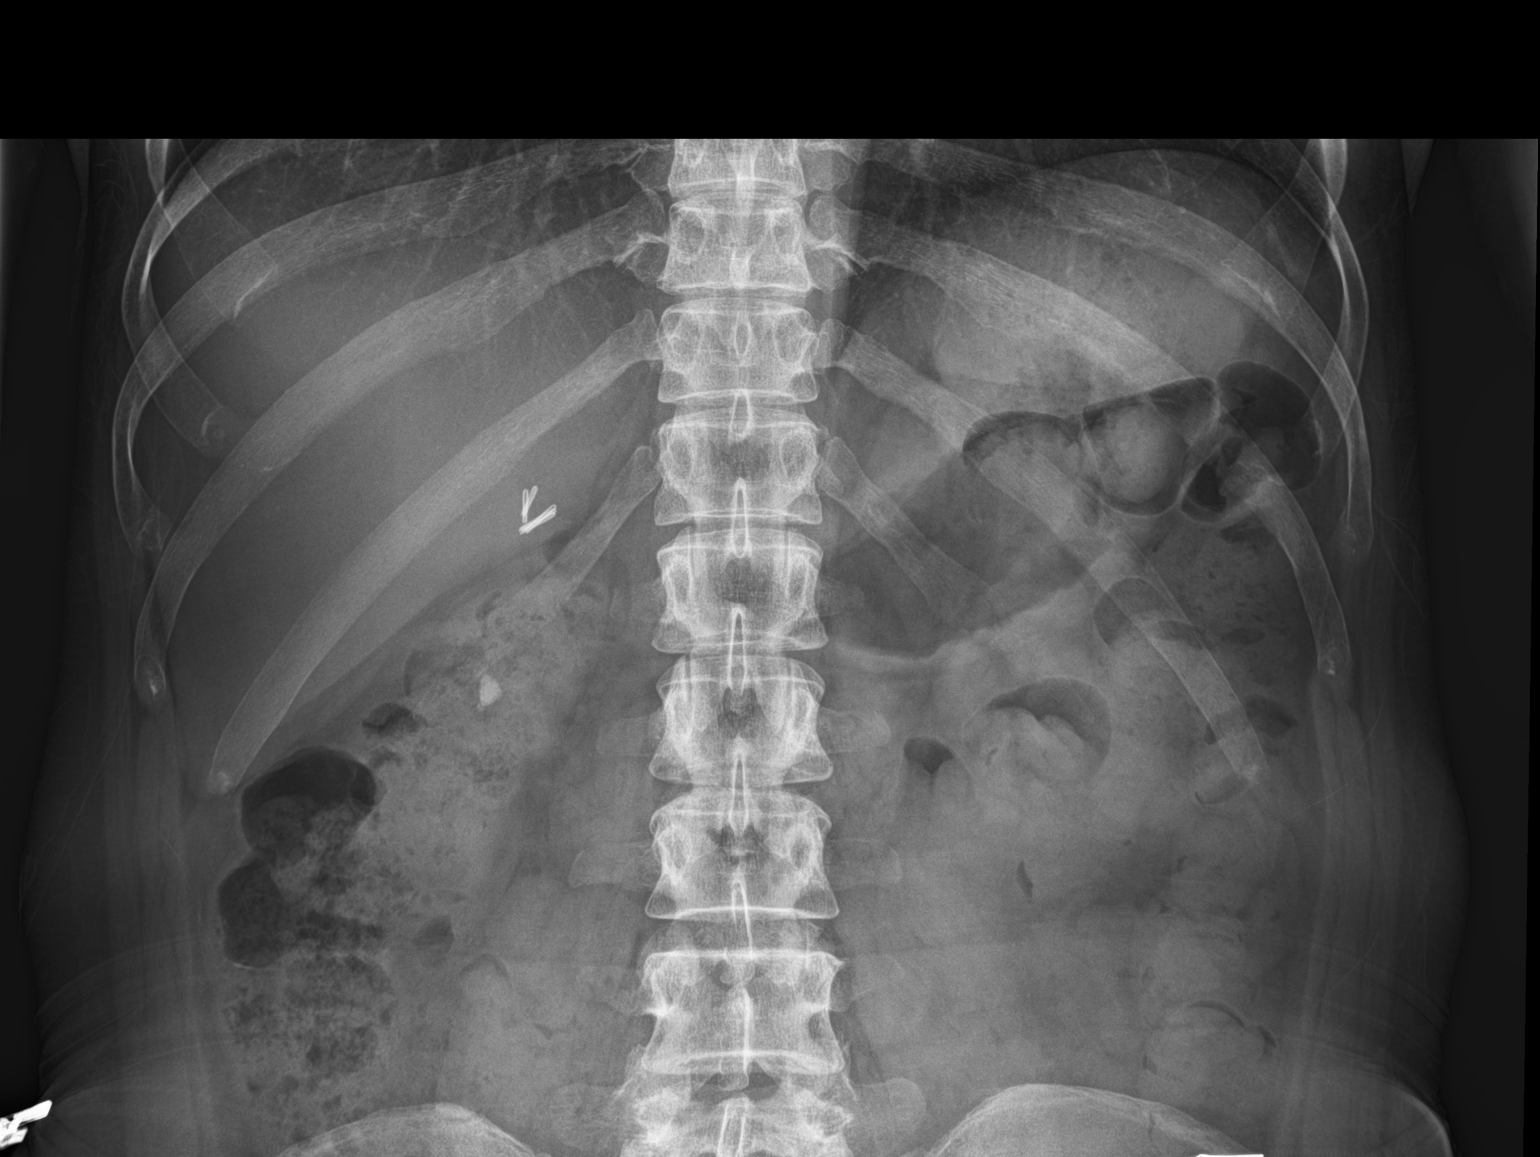

[abdomen kub (2 of 2)]
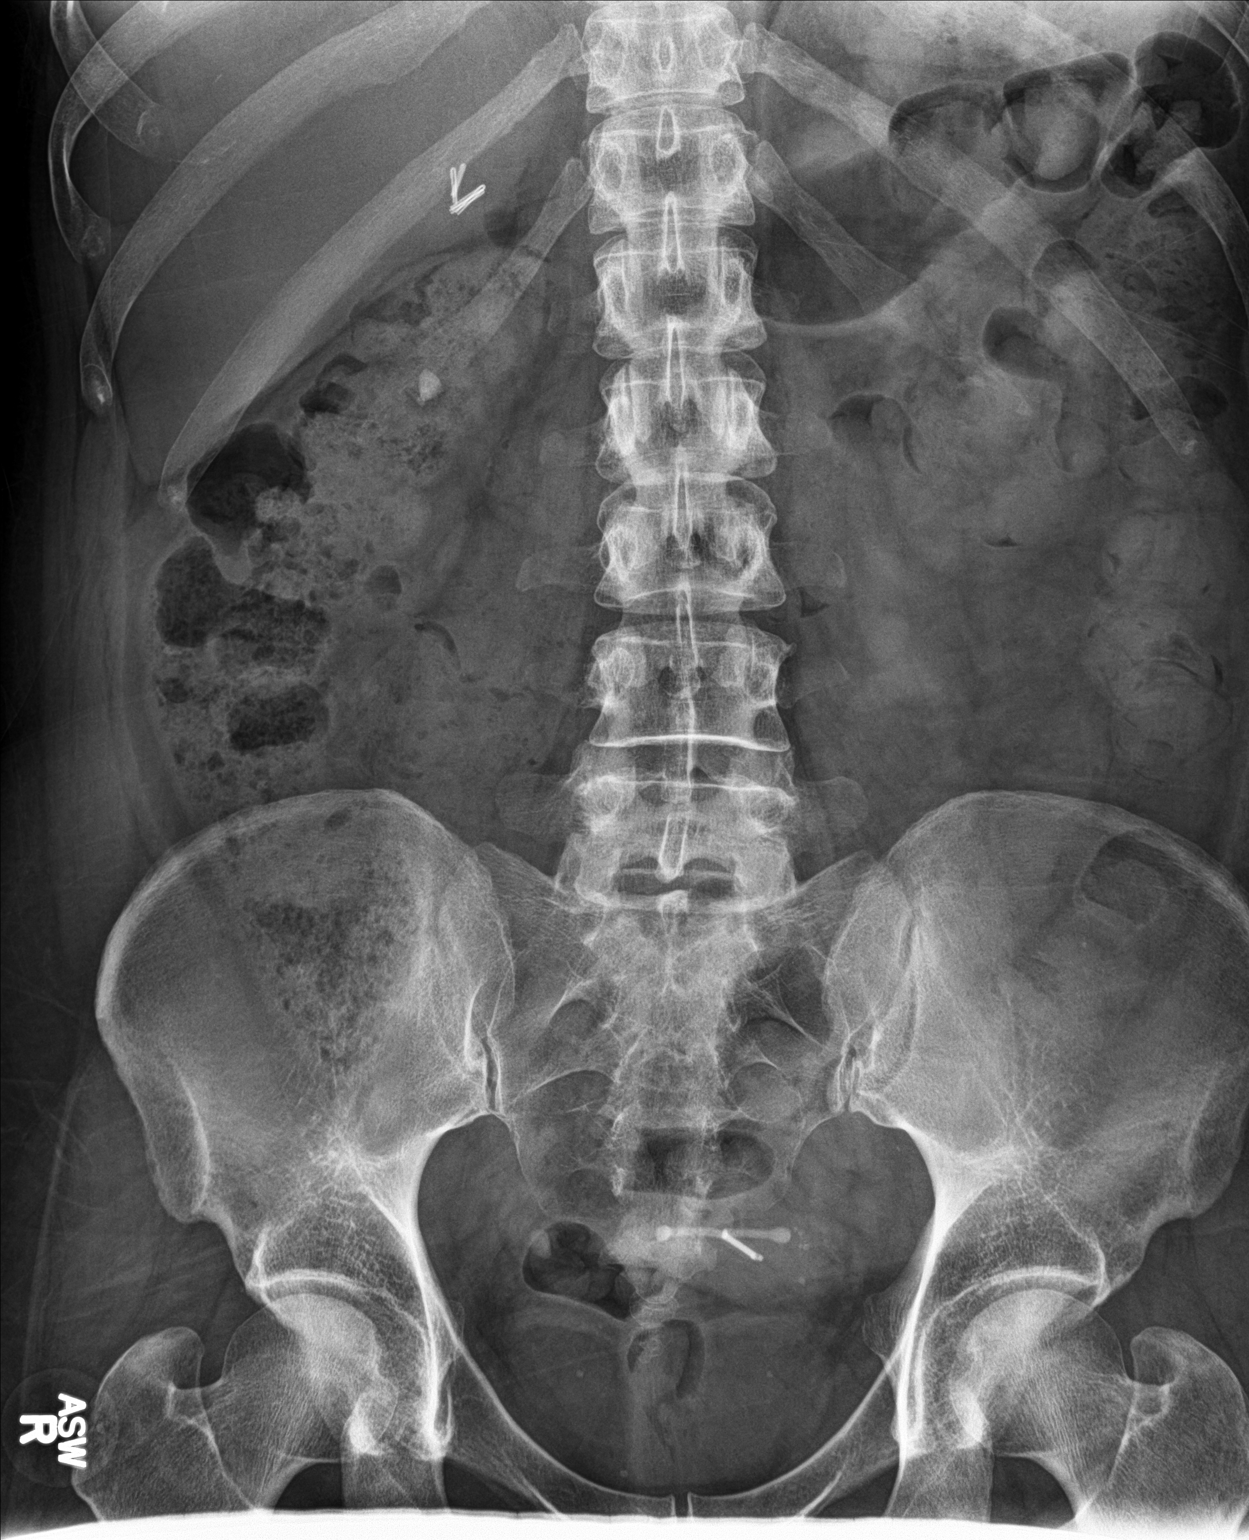

[2 of 2 positions shown; findings below may reference images not displayed]

FINDINGS: 9 mm calcification projecting over the expected location of right
kidney. Nonobstructed gas pattern with large amount of stool in the
colon. 6 mm calcification in the right pelvis, presumably
corresponds to the distal ureteral stone on recent CT. Additional
punctate calcifications corresponding to phleboliths. IUD in the
pelvis.
IMPRESSION: 1. 6 mm calcification in the right pelvis felt to correspond to
distal ureteral stone on recent CT
2. 9 mm calcification overlying the right kidney consistent with
kidney stone
3. Nonobstructed gas pattern

## 2020-03-15 ENCOUNTER — Other Ambulatory Visit: Payer: Self-pay | Admitting: Radiology

## 2020-03-15 DIAGNOSIS — N201 Calculus of ureter: Secondary | ICD-10-CM

## 2020-03-16 ENCOUNTER — Ambulatory Visit: Payer: BC Managed Care – PPO | Admitting: Urology

## 2020-03-30 ENCOUNTER — Ambulatory Visit
Admission: RE | Admit: 2020-03-30 | Discharge: 2020-03-30 | Disposition: A | Discharge status: Home / Self Care | Payer: BC Managed Care – PPO | Attending: Urology | Admitting: Urology

## 2020-03-30 ENCOUNTER — Other Ambulatory Visit: Payer: Self-pay

## 2020-03-30 ENCOUNTER — Ambulatory Visit
Admission: RE | Admit: 2020-03-30 | Discharge: 2020-03-30 | Disposition: A | Discharge status: Home / Self Care | Payer: BC Managed Care – PPO | Source: Ambulatory Visit | Attending: Urology | Admitting: Urology

## 2020-03-30 ENCOUNTER — Ambulatory Visit (INDEPENDENT_AMBULATORY_CARE_PROVIDER_SITE_OTHER): Payer: BC Managed Care – PPO | Admitting: Urology

## 2020-03-30 ENCOUNTER — Encounter: Payer: Self-pay | Admitting: Urology

## 2020-03-30 VITALS — BP 131/87 | HR 86 | Ht 62.0 in | Wt 155.0 lb

## 2020-03-30 DIAGNOSIS — N201 Calculus of ureter: Secondary | ICD-10-CM | POA: Diagnosis not present

## 2020-03-30 DIAGNOSIS — N200 Calculus of kidney: Secondary | ICD-10-CM

## 2020-03-30 NOTE — Progress Notes (Signed)
   03/30/2020 11:29 AM   Cassell Clement 1970/06/28 [id number]  Reason for visit: Follow up nephrolithiasis  HPI: I saw Ms. Villela back in urology clinic for follow-up of nephrolithiasis.  She is a healthy 49 year old female who had an 8 mm right distal ureteral stone and underwent uncomplicated ureteroscopy on 03/26/2019.  She also had a 1 cm renal stone at that point on CT, however on diagnostic ureteroscopy, this appeared to be intraparenchymal both under direct vision, as well as after retrograde pyelogram.  She had one episode of some right groin pain a few months ago that resolved spontaneously, but denies any flank pain, gross hematuria, or urinary symptoms.  I personally viewed and interpreted her KUB today that shows a stable 1 cm calcification over the right kidney, suspected to be intraparenchymal based on prior operative findings.  We discussed general stone prevention strategies including adequate hydration with goal of producing 2.5 L of urine daily, increasing citric acid intake, increasing calcium intake during high oxalate meals, minimizing animal protein, and decreasing salt intake. Information about dietary recommendations given today.   -RTC 1 year with KUB prior, if intraparenchymal renal stone unchanged at that time follow-up as needed -Consider CT if right-sided flank pain for better evaluation of the suspected intraparenchymal stone  Sondra Come, MD  [facility] [address] [place], [place] [address] [phone]

## 2020-03-30 NOTE — Patient Instructions (Signed)
Dietary Guidelines to Help Prevent Kidney Stones Kidney stones are deposits of minerals and salts that form inside your kidneys. Your risk of developing kidney stones may be greater depending on your diet, your lifestyle, the medicines you take, and whether you have certain medical conditions. Most people can reduce their chances of developing kidney stones by following the instructions below. Depending on your overall health and the type of kidney stones you tend to develop, your dietitian may give you more specific instructions. What are tips for following this plan? Reading food labels  Choose foods with "no salt added" or "low-salt" labels. Limit your sodium intake to less than 1500 mg per day.  Choose foods with calcium for each meal and snack. Try to eat about 300 mg of calcium at each meal. Foods that contain 200-500 mg of calcium per serving include: ? 8 oz (237 ml) of milk, fortified nondairy milk, and fortified fruit juice. ? 8 oz (237 ml) of kefir, yogurt, and soy yogurt. ? 4 oz (118 ml) of tofu. ? 1 oz of cheese. ? 1 cup (300 g) of dried figs. ? 1 cup (91 g) of cooked broccoli. ? 1-3 oz can of sardines or mackerel.  Most people need 1000 to 1500 mg of calcium each day. Talk to your dietitian about how much calcium is recommended for you. Shopping  Buy plenty of fresh fruits and vegetables. Most people do not need to avoid fruits and vegetables, even if they contain nutrients that may contribute to kidney stones.  When shopping for convenience foods, choose: ? Whole pieces of fruit. ? Premade salads with dressing on the side. ? Low-fat fruit and yogurt smoothies.  Avoid buying frozen meals or prepared deli foods.  Look for foods with live cultures, such as yogurt and kefir. Cooking  Do not add salt to food when cooking. Place a salt shaker on the table and allow each person to add his or her own salt to taste.  Use vegetable protein, such as beans, textured vegetable  protein (TVP), or tofu instead of meat in pasta, casseroles, and soups. Meal planning   Eat less salt, if told by your dietitian. To do this: ? Avoid eating processed or premade food. ? Avoid eating fast food.  Eat less animal protein, including cheese, meat, poultry, or fish, if told by your dietitian. To do this: ? Limit the number of times you have meat, poultry, fish, or cheese each week. Eat a diet free of meat at least 2 days a week. ? Eat only one serving each day of meat, poultry, fish, or seafood. ? When you prepare animal protein, cut pieces into small portion sizes. For most meat and fish, one serving is about the size of one deck of cards.  Eat at least 5 servings of fresh fruits and vegetables each day. To do this: ? Keep fruits and vegetables on hand for snacks. ? Eat 1 piece of fruit or a handful of berries with breakfast. ? Have a salad and fruit at lunch. ? Have two kinds of vegetables at dinner.  Limit foods that are high in a substance called oxalate. These include: ? Spinach. ? Rhubarb. ? Beets. ? Potato chips and french fries. ? Nuts.  If you regularly take a diuretic medicine, make sure to eat at least 1-2 fruits or vegetables high in potassium each day. These include: ? Avocado. ? Banana. ? Orange, prune, carrot, or tomato juice. ? Baked potato. ? Cabbage. ? Beans and split   peas. General instructions   Drink enough fluid to keep your urine clear or pale yellow. This is the most important thing you can do.  Talk to your health care provider and dietitian about taking daily supplements. Depending on your health and the cause of your kidney stones, you may be advised: ? Not to take supplements with vitamin C. ? To take a calcium supplement. ? To take a daily probiotic supplement. ? To take other supplements such as magnesium, fish oil, or vitamin B6.  Take all medicines and supplements as told by your health care provider.  Limit alcohol intake to no  more than 1 drink a day for nonpregnant women and 2 drinks a day for men. One drink equals 12 oz of beer, 5 oz of wine, or 1 oz of hard liquor.  Lose weight if told by your health care provider. Work with your dietitian to find strategies and an eating plan that works best for you. What foods are not recommended? Limit your intake of the following foods, or as told by your dietitian. Talk to your dietitian about specific foods you should avoid based on the type of kidney stones and your overall health. Grains Breads. Bagels. Rolls. Baked goods. Salted crackers. Cereal. Pasta. Vegetables Spinach. Rhubarb. Beets. Canned vegetables. Pickles. Olives. Meats and other protein foods Nuts. Nut butters. Large portions of meat, poultry, or fish. Salted or cured meats. Deli meats. Hot dogs. Sausages. Dairy Cheese. Beverages Regular soft drinks. Regular vegetable juice. Seasonings and other foods Seasoning blends with salt. Salad dressings. Canned soups. Soy sauce. Ketchup. Barbecue sauce. Canned pasta sauce. Casseroles. Pizza. Lasagna. Frozen meals. Potato chips. French fries. Summary  You can reduce your risk of kidney stones by making changes to your diet.  The most important thing you can do is drink enough fluid. You should drink enough fluid to keep your urine clear or pale yellow.  Ask your health care provider or dietitian how much protein from animal sources you should eat each day, and also how much salt and calcium you should have each day. This information is not intended to replace advice given to you by your health care provider. Make sure you discuss any questions you have with your health care provider. Document Revised: 08/20/2018 Document Reviewed: 04/10/2016 [employer]vier Patient Education  2020 [employer]  

## 2020-04-04 ENCOUNTER — Ambulatory Visit: Payer: BC Managed Care – PPO | Admitting: Urology

## 2020-05-24 ENCOUNTER — Other Ambulatory Visit: Payer: Self-pay

## 2020-05-24 ENCOUNTER — Other Ambulatory Visit: Payer: BC Managed Care – PPO

## 2020-05-24 DIAGNOSIS — Z20822 Contact with and (suspected) exposure to covid-19: Secondary | ICD-10-CM | POA: Diagnosis not present

## 2020-05-25 ENCOUNTER — Other Ambulatory Visit: Payer: BC Managed Care – PPO

## 2020-05-26 DIAGNOSIS — J029 Acute pharyngitis, unspecified: Secondary | ICD-10-CM | POA: Diagnosis not present

## 2020-05-26 DIAGNOSIS — J329 Chronic sinusitis, unspecified: Secondary | ICD-10-CM | POA: Diagnosis not present

## 2020-05-26 DIAGNOSIS — Z03818 Encounter for observation for suspected exposure to other biological agents ruled out: Secondary | ICD-10-CM | POA: Diagnosis not present

## 2020-05-26 DIAGNOSIS — R6889 Other general symptoms and signs: Secondary | ICD-10-CM | POA: Diagnosis not present

## 2020-05-26 LAB — NOVEL CORONAVIRUS, NAA: SARS-CoV-2, NAA: NOT DETECTED

## 2020-05-26 LAB — SARS-COV-2, NAA 2 DAY TAT

## 2020-12-02 DIAGNOSIS — Z01419 Encounter for gynecological examination (general) (routine) without abnormal findings: Secondary | ICD-10-CM | POA: Diagnosis not present

## 2021-01-10 DIAGNOSIS — M79642 Pain in left hand: Secondary | ICD-10-CM | POA: Diagnosis not present

## 2021-01-10 DIAGNOSIS — M25541 Pain in joints of right hand: Secondary | ICD-10-CM | POA: Diagnosis not present

## 2021-01-10 DIAGNOSIS — M79641 Pain in right hand: Secondary | ICD-10-CM | POA: Diagnosis not present

## 2021-01-10 DIAGNOSIS — M25542 Pain in joints of left hand: Secondary | ICD-10-CM | POA: Diagnosis not present

## 2021-01-10 DIAGNOSIS — M064 Inflammatory polyarthropathy: Secondary | ICD-10-CM | POA: Diagnosis not present

## 2021-01-10 DIAGNOSIS — Z1211 Encounter for screening for malignant neoplasm of colon: Secondary | ICD-10-CM | POA: Diagnosis not present

## 2021-01-13 DIAGNOSIS — Z1231 Encounter for screening mammogram for malignant neoplasm of breast: Secondary | ICD-10-CM | POA: Diagnosis not present

## 2021-03-30 ENCOUNTER — Ambulatory Visit: Payer: BC Managed Care – PPO | Admitting: Urology
